# Patient Record
Sex: Male | Born: 1960 | Race: Black or African American | Hispanic: No | Smoking: Former smoker
Health system: Southern US, Community
[De-identification: ages and names within clinical notes are randomized; demographics above are authoritative.]

## PROBLEM LIST (undated history)

## (undated) DIAGNOSIS — E119 Type 2 diabetes mellitus without complications: Secondary | ICD-10-CM

## (undated) DIAGNOSIS — I10 Essential (primary) hypertension: Secondary | ICD-10-CM

---

## 1999-07-18 ENCOUNTER — Emergency Department (HOSPITAL_COMMUNITY): Admission: EM | Admit: 1999-07-18 | Discharge: 1999-07-18 | Payer: Self-pay | Admitting: Emergency Medicine

## 2006-07-25 ENCOUNTER — Emergency Department (HOSPITAL_COMMUNITY): Admission: EM | Admit: 2006-07-25 | Discharge: 2006-07-25 | Payer: Self-pay | Admitting: Family Medicine

## 2008-02-13 ENCOUNTER — Emergency Department (HOSPITAL_COMMUNITY): Admission: EM | Admit: 2008-02-13 | Discharge: 2008-02-13 | Payer: Self-pay | Admitting: Emergency Medicine

## 2008-03-18 ENCOUNTER — Ambulatory Visit (HOSPITAL_COMMUNITY): Admission: RE | Admit: 2008-03-18 | Discharge: 2008-03-18 | Payer: Self-pay | Admitting: Urology

## 2009-10-29 ENCOUNTER — Ambulatory Visit: Payer: Self-pay | Admitting: Gastroenterology

## 2009-10-29 DIAGNOSIS — D649 Anemia, unspecified: Secondary | ICD-10-CM

## 2009-10-29 DIAGNOSIS — R748 Abnormal levels of other serum enzymes: Secondary | ICD-10-CM | POA: Insufficient documentation

## 2009-10-30 LAB — CONVERTED CEMR LAB: Sickle Cell Screen: NEGATIVE

## 2009-11-21 ENCOUNTER — Ambulatory Visit (HOSPITAL_COMMUNITY)
Admission: RE | Admit: 2009-11-21 | Discharge: 2009-11-21 | Payer: Self-pay | Source: Home / Self Care | Admitting: Gastroenterology

## 2009-11-21 ENCOUNTER — Ambulatory Visit: Payer: Self-pay | Admitting: Gastroenterology

## 2009-11-21 ENCOUNTER — Telehealth (INDEPENDENT_AMBULATORY_CARE_PROVIDER_SITE_OTHER): Payer: Self-pay

## 2009-11-24 ENCOUNTER — Telehealth (INDEPENDENT_AMBULATORY_CARE_PROVIDER_SITE_OTHER): Payer: Self-pay

## 2009-11-24 ENCOUNTER — Encounter: Payer: Self-pay | Admitting: Gastroenterology

## 2009-11-28 ENCOUNTER — Encounter (INDEPENDENT_AMBULATORY_CARE_PROVIDER_SITE_OTHER): Payer: Self-pay

## 2009-12-03 ENCOUNTER — Encounter (INDEPENDENT_AMBULATORY_CARE_PROVIDER_SITE_OTHER): Payer: Self-pay

## 2009-12-05 ENCOUNTER — Encounter (INDEPENDENT_AMBULATORY_CARE_PROVIDER_SITE_OTHER): Payer: Self-pay | Admitting: *Deleted

## 2009-12-15 ENCOUNTER — Encounter: Payer: Self-pay | Admitting: Gastroenterology

## 2009-12-17 ENCOUNTER — Encounter (INDEPENDENT_AMBULATORY_CARE_PROVIDER_SITE_OTHER): Payer: Self-pay

## 2009-12-19 ENCOUNTER — Encounter (INDEPENDENT_AMBULATORY_CARE_PROVIDER_SITE_OTHER): Payer: Self-pay

## 2010-01-15 ENCOUNTER — Encounter: Payer: Self-pay | Admitting: Gastroenterology

## 2010-01-15 ENCOUNTER — Encounter (INDEPENDENT_AMBULATORY_CARE_PROVIDER_SITE_OTHER): Payer: Self-pay

## 2010-01-19 ENCOUNTER — Encounter: Payer: Self-pay | Admitting: Internal Medicine

## 2010-02-19 ENCOUNTER — Encounter: Payer: Self-pay | Admitting: Internal Medicine

## 2010-02-24 ENCOUNTER — Encounter: Payer: Self-pay | Admitting: Gastroenterology

## 2010-02-24 ENCOUNTER — Encounter: Payer: Self-pay | Admitting: Internal Medicine

## 2010-02-25 ENCOUNTER — Ambulatory Visit (HOSPITAL_COMMUNITY): Admission: RE | Admit: 2010-02-25 | Payer: Self-pay | Source: Home / Self Care | Admitting: Internal Medicine

## 2010-03-16 ENCOUNTER — Ambulatory Visit: Admit: 2010-03-16 | Payer: Self-pay | Admitting: Gastroenterology

## 2010-03-31 NOTE — Letter (Signed)
Summary: Normal Results Letter  [facility]  [address]   [place], [place] [address]   Phone: [phone]  Fax: [phone]    November 28, 2009  Kolston Woelfel [address] [place], [place]  [address] 17-Aug-1960   Dear Mr. BULKLEY,   Our office has been trying to contact you.  We just wanted to inform you that all of your tests were normal.  Please call the office at [phone] to schedule capsule study to complete your evaluation.   Thank you,  Hendricks Limes, LPN Cloria Spring, LPN  [facility] Ph: [phone]   Fax: [phone]

## 2010-03-31 NOTE — Letter (Signed)
Summary: Radiology Test Reminder  [facility]  [address]   [place], [place] [address]   Phone: [phone]  Fax: [phone]     December 05, 2009   Charles Travis [address] [place], [place]  [address] [birth year]  Dear [name],  During your last appointment, your doctor requested you have Givens Capsule Study. We had you scheduled for this test on December 05, 2009.  Our records indicate you have not had this done.  Remember it is very important to follow your doctor's instructions.    Please call our office and we can reschedule this for you.  It is important that patients and their doctor work together in the management and treatment of their health care.  If you have already had your test done, please disregard this letter.  Thank you,    Ave Filter  [facility] Ph: [phone]   Fax: [phone]

## 2010-03-31 NOTE — Letter (Signed)
Summary: Plan of Care, Need to Discuss  [facility]  [address]   [place], [place] [address]   Phone: [phone]  Fax: [phone]    January 15, 2010  Charles Travis [address] [place], [place]  [address] Jul 02, 1960   Dear [name],   We are writing this letter to inform you of treatment plans and/or discuss your plan of care.  We have tried several times to contact you. Dr. Darrick Penna said you need to submit a stool test, we will have the bottle and instructions at the front desk for pick-up. Please ask to speak to the nurse when you pick it up to get verbal instructions/explanation on how to do the test.  Look forward to hearing from you soon.  Please do not neglect your health.   Sincerely,    Cloria Spring LPN  [facility] Ph: [phone]    Fax: [phone]

## 2010-03-31 NOTE — Miscellaneous (Signed)
Summary: Orders Update  Clinical Lists Changes  Orders: Added new Test order of T-CBC w/Diff ([id number]) - Signed Added new Test order of T-Ferritin ([id number]) - Signed 

## 2010-03-31 NOTE — Progress Notes (Signed)
Summary: pt wants to reschedule his Givens  Phone Note Call from Patient   Caller: Patient Summary of Call: Pt called and would like to reschedule his Givens in December around 27th, 28th or 29th. Please advise! would like to know if it is more urgent and should be done before them. Initial call taken by: Cloria Spring LPN,  November 24, 2009 10:02 AM     Appended Document: pt wants to reschedule his Givens Call back number is [phone].  Appended Document: pt wants to reschedule his Givens Please call pt. The highest yield for finding a cause for his low blood count is if we do teh capsule ASAP. I do not recommedn waiting until DEC to have the study performed.  Appended Document: pt wants to reschedule his [employer] Called the call back number above and it was not working, called the above home number listed and it is his Mom's number. She gave me another number to call [phone] and it was not working. I called her back and asked her to have him call me. She said she would.  Appended Document: pt wants to reschedule his Givens Called the home number and many rings and now answer. Called the [phone] and the call would not go through. Mailing a letter to all.

## 2010-03-31 NOTE — Assessment & Plan Note (Signed)
Summary: ANEMIA, elevated alkaline phoshatase   Visit Type:  Initial Consult Referring Provider:  Sasser Primary Care Provider:  Neita Carp, M.D.  Chief Complaint:  Anemia/consult for tcs/egd.  History of Present Illness: No bleeding from rectum, change in bowel habits. BMs: 2-3 times a day, soft and loose. No black tarry stools, abd pain, nausea, problems swallowing, or vomiting. Weight loss: intentional, 20 lbs. No constipation, heartburn, or indigestion. Not taking Naproxen-was taking for his hand pain for one year.  Takes ASA, but no BC, Goodys, Ibuprofen, or Motrin.  Preventive Screening-Counseling & Management  Alcohol-Tobacco     Smoking Status: quit  Current Medications (verified): 1)  Amlodipine Besylate 5 Mg Tabs (Amlodipine Besylate) .... Take 1 Tablet By Mouth Once A Day 2)  Metformin Hcl 500 Mg Tabs (Metformin Hcl) .... Two Tablets Twice Daily 3)  Lisinopril-Hydrochlorothiazide 20-12.5 Mg Tabs (Lisinopril-Hydrochlorothiazide) .... Take 1 Tablet By Mouth Once A Day 4)  Aspirin 81 Mg Tbec (Aspirin) .... Take 1 Tablet By Mouth Once A Day 5)  Protonix 40 Mg Tbec (Pantoprazole Sodium) .... Take 1 Tablet By Mouth Once A Day 6)  Zyloprim 300 Mg Tabs (Allopurinol) .... Take 1 Tablet By Mouth Once A Day 7)  Flovent Diskus 50 Mcg/blist Aepb (Fluticasone Propionate (Inhal)) .... As Directed  Allergies (verified): No Known Drug Allergies  Past History:  Past Medical History: Diabetes: x 5 years, HgA1c 8.2-12 IN 2011 Hypertension GERD Allergies  Past Surgical History: None  Family History: FH of Colon Cancer: uncle No Polyps Mom has leukemia  Social History: Widowed: 3 bio kids, youngest-26, 4 step kids Girlfriend-Patricia Set designer for [employer]. 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 Quit 3 years. Alcohol Use - yes: every other weekend Smoking Status:  quit  Review of Systems       No chest pain, or SOB.  DEC 2010: URINE  MICROALB 5.02 Jun 2009: ALK PHOS 149 AST 26 ALT 33 CR 0.81 TRIG 1087 H  HGA1C 8.2   Oct 07 2009:  ALK PHOS 158H(ULN 150) AST 24 ALT 25 T BILI 0.2 HB 10.5L MCV 78 L  PLT 135  TRIG 414 H HGA1C 12H  Oct 14 2009: TIBC 428 FERRITIN 313 B12 339 HB 11.4L   MCV 77L PLT 164  RETICULOCYTE INDEX 1.66-2.3 BASED ON LAB NORMS(HCT 36-50)  (> 2%: normal, <2%: hypoproliferation)  Per HPI otherwise all systems negative.  Vital Signs:  Patient profile:   49 year old male Height:      74 inches Weight:      269 pounds BMI:     34.66 Temp:     98.9 degrees F oral Pulse rate:   84 / minute BP sitting:   120 / 82  Vitals Entered By: Cloria Spring LPN (October 29, 2009 1:27 PM)  Physical Exam  General:  Well developed, well nourished, no acute distress. Head:  Normocephalic and atraumatic. Eyes:  PERRL, no icterus. Mouth:  No deformity or lesions. Neck:  Supple; no masses. Lungs:  Clear throughout to auscultation. Heart:  Regular rate and rhythm; no murmurs. Abdomen:  Soft, nontender and nondistended. No masses,  or hernias noted. Normal bowel sounds. Extremities:  No edema or deformities noted. Neurologic:  Alert and  oriented x4;  grossly normal neurologically.  Impression & Recommendations:  Problem # 1:  ANEMIA, MILD (ICD-285.9) MICROCYTIC ANEMIA WITH normal Ferritin and TIBC. Microcytic indices suggest early FeDA in a pt on ASA and Naproxen. Differential diagnosis includes H.  pylori gastritis, NSAID gastritis, colorectal polyps and less likely CRC, SICKLE CELL TRAIT CAUSING MICROCYTIC INDICES, PUD, or AVMs. TCS/EGD on 9/23-halflytely prep. Stop iron on 11/15/2009. CONTINUE METFORMIN MAY HAVE SCRAMBLED EGGS FOR BREAKFAST TO TAKE PILLS. START CLEAR LIQUIDS AFTER EATING THE EGGS. FOLLOW UP IN 4 MOS. Check sickle cell screen.  PT GIVES PERMISSION TO PATRICIA WILLIAMS TO DISCUSS HEALTH INFORMATION.  CC: PCP  Problem # 2:  ALKALINE PHOSPHATASE, ELEVATED (ICD-790.5) Assessment: Comment Only Not  clinically significant. Repeat HFP at next visit.  Other Orders: Sickle Cell Scr[facility] ([id number])  Patient Instructions: 1)  Pt needs upper and lower endoscopy on 11/21/2009. 2)  Stop iron on 11/15/2009. CONTINUE METFORMIN 3)  MAY HAVE SCRAMBLED EGGS FOR BREAKFAST TO TAKE PILLS. 4)  START CLEAR LIQUIDS AFTER EATING THE EGGS. 5)  FOLLOW UP IN 4 MOS. 6)  The medication list was reviewed and reconciled.  All changed / newly prescribed medications were explained.  A complete medication list was provided to the patient / caregiver.  Appended Document: Orders Update    Clinical Lists Changes  Orders: Added new Service order of Consultation Level IV [phone]) - Signed      Appended Document: ANEMIA, elevated alkaline phoshatase 4 MONTH F/U OPV IS IN THE COMPUTER

## 2010-03-31 NOTE — Miscellaneous (Signed)
Summary: Orders Update  Clinical Lists Changes  Orders: Added new Test order of T-CBC w/Diff [phone]) - Signed Added new Test order of T-Ferritin ([id number]) - Signed  Appended Document: Orders Update Please contact pt. He needs to submit stool for iFOBT.  Appended Document: Orders Update Called the number listed, [phone], his Mom answered. Gave me his cell number, [phone], and call would not go through. Called another number that has left before to call, [phone] and left message to call. Will also mail a letter informing him that he needs the iFOBT.

## 2010-03-31 NOTE — Letter (Signed)
Summary: GIVENS ORDER  GIVENS ORDER   Imported By: Ave Filter 11/21/2009 10:36:16  _____________________________________________________________________  External Attachment:    Type:   Image     Comment:   External Document

## 2010-03-31 NOTE — Letter (Signed)
Summary: Recall, Labs Needed  [facility]  [address]   [place], [place] [address]   Phone: [phone]  Fax: [phone]    December 17, 2009  Eason Nuss [address] [place], [place]  [address] 1960-08-21   Dear Mr. GABBARD,   Our records indicate it is time to repeat your blood work.  You can take the enclosed form to the lab on or near the date indicated.  Please make note of the new location of the lab:   [address], 2nd floor   [facility]  Our office will call you within a week to ten business days with the results.  If you do not hear from [place] in 10 business days, you should call the office.  If you have any questions regarding this, call the office at [phone], and ask for the nurse.  Labs are due on 01/21/2010.   Sincerely,    Hendricks Limes LPN  [facility] Ph: [phone]   Fax: [phone]

## 2010-03-31 NOTE — Progress Notes (Signed)
----   Converted from flag ---- ---- 11/21/2009 9:53 AM, Ave Filter wrote: Dr Darrick Penna would like a repeat CBC and Ferrtin in two months. ------------------------------

## 2010-03-31 NOTE — Letter (Signed)
Summary: TCS/EGD ORDER  TCS/EGD ORDER   Imported By: Ave Filter 10/29/2009 14:53:59  _____________________________________________________________________  External Attachment:    Type:   Image     Comment:   External Document

## 2010-03-31 NOTE — Miscellaneous (Signed)
Summary: Orders Update  Clinical Lists Changes  Orders: Added new Test order of T-CBC w/Diff [phone]) - Signed Added new Test order of T-Ferritin [phone]) - Signed  Appended Document: Orders Update please disregard this update. DS had already put the orders in, so I cancelled these.

## 2010-03-31 NOTE — Letter (Signed)
Summary: Plan of Care, Need to Discuss  [facility]  [address]   [place], [place] [address]   Phone: [phone]  Fax: [phone]    December 19, 2009  Charles Travis [address] [place], [place]  [address] [birth year]   Dear [name],   We are writing this letter to inform you of treatment plans and/or discuss your plan of care.  We have tried several times to contact you; however, we have yet to reach you.  We ask that you please contact our office for follow-up on your gastrointestinal issues.  We can  be reached at [phone] to schedule an appointment, or to speak with someone regarding your health care needs.  Please do not neglect your health.   Sincerely,    Cloria Spring LPN  [facility] Ph: [phone]    Fax: [phone]   Appended Document: Plan of Care, Need to Discuss Pt LMOM and can reach him at [phone]  Appended Document: Plan of Care, Need to Discuss Called and [name] for pt to call.  Appended Document: Plan of Care, Need to Discuss LMOM to call.  Appended Document: Plan of Care, Need to Discuss Mailed letter to call on 01/15/2010.

## 2010-03-31 NOTE — Letter (Signed)
Summary: [employer]---INFO OF CAPSULE STUDY  [employer]BS---INFO OF CAPSULE STUDY   Imported By: Ave Filter 12/15/2009 08:41:31  _____________________________________________________________________  External Attachment:    Type:   Image     Comment:   External Document  Appended Document: BCBS---INFO OF CAPSULE STUDY Please call pt. [employer] denied capsule study. Needs iFOBT.  Appended Document: [employer]---INFO OF CAPSULE STUDY Called x 2 and busy.  Appended Document: [employer]---INFO OF CAPSULE STUDY Called, many rings and now answer. Mailing letter to call.

## 2010-03-31 NOTE — Letter (Signed)
Summary: Plan of Care, Need to Discuss  [facility]  [address]   [place], [place] [address]   Phone: [phone]  Fax: [phone]    December 03, 2009  Charles Travis [address] [place], [place]  [address] 06-14-1960   Dear [name],   We are writing this letter to inform you of treatment plans and/or discuss your plan of care.  We have tried several times to contact you; however, we have yet to reach you.  We ask that you please contact our office for follow-up on your gastrointestinal issues.  We can  be reached at [phone] to schedule an appointment, or to speak with someone regarding your health care needs.  Please do not neglect your health.   Sincerely,    Cloria Spring LPN  [facility] Ph: [phone]    Fax: [phone]

## 2010-04-02 NOTE — Letter (Signed)
Summary: RECEIPT OF DENIAL FOR GIVENS CAPSULE FROM BCBS  RECIPT OF DENIAL FOR GIVENS CAPSULE FROM [employer]   Imported By: Ave Filter 02/24/2010 09:22:05  _____________________________________________________________________  External Attachment:    Type:   Image     Comment:   External Document

## 2010-04-02 NOTE — Letter (Signed)
Summary: GIVENS ORDER  GIVENS ORDER   Imported By: Ave Filter 01/19/2010 12:15:05  _____________________________________________________________________  External Attachment:    Type:   Image     Comment:   External Document  Appended Document: GIVENS ORDER Pt called back and stated since his ins is not paying for him to have Givens Capsule. He doesn't want to have the procedure. I explained to him we can do an appeal for the procedure with the ins company and he stated he still didn't want to have this procedure.

## 2010-04-02 NOTE — Letter (Signed)
Summary: INS RECEIPT REQUEST  INS RECEIPT REQUEST   Imported By: Ave Filter 02/19/2010 15:24:38  _____________________________________________________________________  External Attachment:    Type:   Image     Comment:   External Document

## 2010-12-04 LAB — POCT I-STAT, CHEM 8
Chloride: 104 mEq/L (ref 96–112)
Creatinine, Ser: 1.3 mg/dL (ref 0.4–1.5)
Glucose, Bld: 158 mg/dL — ABNORMAL HIGH (ref 70–99)
HCT: 36 % — ABNORMAL LOW (ref 39.0–52.0)
Potassium: 3.7 mEq/L (ref 3.5–5.1)

## 2012-01-23 ENCOUNTER — Emergency Department (HOSPITAL_COMMUNITY)
Admission: EM | Admit: 2012-01-23 | Discharge: 2012-01-23 | Disposition: A | Discharge status: Home / Self Care | Payer: Worker's Compensation | Attending: Emergency Medicine | Admitting: Emergency Medicine

## 2012-01-23 ENCOUNTER — Encounter (HOSPITAL_COMMUNITY): Payer: Self-pay | Admitting: Emergency Medicine

## 2012-01-23 ENCOUNTER — Emergency Department (HOSPITAL_COMMUNITY): Payer: Worker's Compensation

## 2012-01-23 DIAGNOSIS — R0602 Shortness of breath: Secondary | ICD-10-CM | POA: Insufficient documentation

## 2012-01-23 DIAGNOSIS — E119 Type 2 diabetes mellitus without complications: Secondary | ICD-10-CM | POA: Insufficient documentation

## 2012-01-23 DIAGNOSIS — R059 Cough, unspecified: Secondary | ICD-10-CM | POA: Insufficient documentation

## 2012-01-23 DIAGNOSIS — R062 Wheezing: Secondary | ICD-10-CM | POA: Insufficient documentation

## 2012-01-23 DIAGNOSIS — J9801 Acute bronchospasm: Secondary | ICD-10-CM | POA: Insufficient documentation

## 2012-01-23 DIAGNOSIS — Z87891 Personal history of nicotine dependence: Secondary | ICD-10-CM | POA: Insufficient documentation

## 2012-01-23 DIAGNOSIS — J9809 Other diseases of bronchus, not elsewhere classified: Secondary | ICD-10-CM

## 2012-01-23 DIAGNOSIS — R05 Cough: Secondary | ICD-10-CM | POA: Insufficient documentation

## 2012-01-23 DIAGNOSIS — Z79899 Other long term (current) drug therapy: Secondary | ICD-10-CM | POA: Insufficient documentation

## 2012-01-23 HISTORY — DX: Type 2 diabetes mellitus without complications: E11.9

## 2012-01-23 LAB — COMPREHENSIVE METABOLIC PANEL
Alkaline Phosphatase: 181 U/L — ABNORMAL HIGH (ref 39–117)
BUN: 16 mg/dL (ref 6–23)
CO2: 26 mEq/L (ref 19–32)
Chloride: 102 mEq/L (ref 96–112)
GFR calc Af Amer: >90 mL/min (ref >90)
GFR calc non Af Amer: >90 mL/min (ref >90)
Glucose, Bld: 349 mg/dL — ABNORMAL HIGH (ref 70–99)
Potassium: 3.6 mEq/L (ref 3.5–5.1)
Total Bilirubin: 0.2 mg/dL — ABNORMAL LOW (ref 0.3–1.2)

## 2012-01-23 LAB — CBC WITH DIFFERENTIAL/PLATELET
Basophils Relative: 0 % (ref 0–1)
Eosinophils Absolute: 0.1 10*3/uL (ref 0.0–0.7)
Eosinophils Relative: 1 % (ref 0–5)
HCT: 36.2 % — ABNORMAL LOW (ref 39.0–52.0)
Hemoglobin: 11.7 g/dL — ABNORMAL LOW (ref 13.0–17.0)
Lymphocytes Relative: 16 % (ref 12–46)
MCHC: 32.3 g/dL (ref 30.0–36.0)
Monocytes Relative: 5 % (ref 3–12)
Neutro Abs: 7.4 10*3/uL (ref 1.7–7.7)

## 2012-01-23 LAB — POCT I-STAT TROPONIN I: Troponin i, poc: 0 ng/mL (ref 0.00–0.08)

## 2012-01-23 MED ORDER — ALBUTEROL SULFATE HFA 108 (90 BASE) MCG/ACT IN AERS: 2.0000 | INHALATION_SPRAY | RESPIRATORY_TRACT | Status: DC | PRN

## 2012-01-23 MED ORDER — IPRATROPIUM BROMIDE 0.02 % IN SOLN
0.5000 mg | Freq: Once | RESPIRATORY_TRACT | Status: AC
  Administered 2012-01-23: 0.5 mg via RESPIRATORY_TRACT
  Filled 2012-01-23: qty 2.5

## 2012-01-23 MED ORDER — INSULIN ASPART 100 UNIT/ML ~~LOC~~ SOLN
10.0000 [IU] | Freq: Once | SUBCUTANEOUS | Status: AC
  Administered 2012-01-23: 10 [IU] via SUBCUTANEOUS
  Filled 2012-01-23: qty 1

## 2012-01-23 MED ORDER — INSULIN REGULAR HUMAN 100 UNIT/ML IJ SOLN: 10.0000 [IU] | Freq: Once | INTRAMUSCULAR | Status: DC

## 2012-01-23 MED ORDER — METHYLPREDNISOLONE SODIUM SUCC 125 MG IJ SOLR
125.0000 mg | Freq: Once | INTRAMUSCULAR | Status: AC
  Administered 2012-01-23: 125 mg via INTRAVENOUS
  Filled 2012-01-23: qty 2

## 2012-01-23 MED ORDER — ALBUTEROL (5 MG/ML) CONTINUOUS INHALATION SOLN: 10.0000 mg/h | INHALATION_SOLUTION | Freq: Once | RESPIRATORY_TRACT | Status: DC

## 2012-01-23 MED ORDER — ALBUTEROL SULFATE (5 MG/ML) 0.5% IN NEBU
INHALATION_SOLUTION | RESPIRATORY_TRACT | Status: AC
  Filled 2012-01-23: qty 2

## 2012-01-23 MED ORDER — ALBUTEROL (5 MG/ML) CONTINUOUS INHALATION SOLN
10.0000 mg/h | INHALATION_SOLUTION | Freq: Once | RESPIRATORY_TRACT | Status: AC
  Administered 2012-01-23: 10 mg/h via RESPIRATORY_TRACT

## 2012-01-23 MED ORDER — ALBUTEROL SULFATE (5 MG/ML) 0.5% IN NEBU
5.0000 mg | INHALATION_SOLUTION | Freq: Once | RESPIRATORY_TRACT | Status: AC
  Administered 2012-01-23: 5 mg via RESPIRATORY_TRACT
  Filled 2012-01-23: qty 1

## 2012-01-23 MED ORDER — SODIUM CHLORIDE 0.9 % IV BOLUS (SEPSIS)
1000.0000 mL | Freq: Once | INTRAVENOUS | Status: AC
  Administered 2012-01-23: 1000 mL via INTRAVENOUS

## 2012-01-23 MED ORDER — PREDNISONE 50 MG PO TABS: 50.0000 mg | ORAL_TABLET | Freq: Every day | ORAL | Status: DC

## 2012-01-23 NOTE — ED Provider Notes (Signed)
History     CSN: [id number]  Arrival date & time 01/23/12  1058   First MD Initiated Contact with Patient 01/23/12 1117      Chief Complaint  Patient presents with  . Shortness of Breath    inhaled chlorine dioxide at work on Thursday noc, chest pain and shortness of breath since  . Chest Pain    (Consider location/radiation/quality/duration/timing/severity/associated sxs/prior treatment) HPI Pt exposed to chlorine gas Thursday at work and has had chest tightness, wheezing and cough since. No fever chills. No lower ext swelling or pain.  Past Medical History  Diagnosis Date  . Diabetes mellitus without complication     History reviewed. No pertinent past surgical history.  No family history on file.  History  Substance Use Topics  . Smoking status: Former Smoker    Quit date: 01/22/2006  . Smokeless tobacco: Never Used  . Alcohol Use: Yes     Comment: occasionally      Review of Systems  Constitutional: Negative for fever and chills.  HENT: Negative for neck pain.   Respiratory: Positive for cough, chest tightness, shortness of breath and wheezing.   Cardiovascular: Positive for chest pain. Negative for palpitations and leg swelling.  Gastrointestinal: Negative for nausea, vomiting and abdominal pain.  Skin: Negative for rash and wound.  Neurological: Negative for dizziness, weakness, numbness and headaches.    Allergies  Review of patient's allergies indicates no known allergies.  Home Medications   Current Outpatient Rx  Name  Route  Sig  Dispense  Refill  . ALBUTEROL SULFATE HFA 108 (90 BASE) MCG/ACT IN AERS   Inhalation   Inhale 2 puffs into the lungs every 6 (six) hours as needed. For shortness of breath.         . ALLOPURINOL 300 MG PO TABS   Oral   Take 300 mg by mouth daily.         Marland Kitchen AMLODIPINE BESYLATE 5 MG PO TABS   Oral   Take 5 mg by mouth daily.         Marland Kitchen FERROUS SULFATE 325 (65 FE) MG PO TABS   Oral   Take 325 mg by mouth  daily with breakfast.         . GEMFIBROZIL 600 MG PO TABS   Oral   Take 600 mg by mouth 2 (two) times daily.         Marland Kitchen GLIPIZIDE ER 10 MG PO TB24   Oral   Take 10 mg by mouth daily.         Marland Kitchen LISINOPRIL-HYDROCHLOROTHIAZIDE 20-12.5 MG PO TABS   Oral   Take 1 tablet by mouth daily.         Marland Kitchen METFORMIN HCL 500 MG PO TABS   Oral   Take 1,000 mg by mouth 2 (two) times daily with a meal.         . ADULT MULTIVITAMIN W/MINERALS CH   Oral   Take 1 tablet by mouth daily.         Marland Kitchen PANTOPRAZOLE SODIUM 40 MG PO TBEC   Oral   Take 40 mg by mouth daily.         Marland Kitchen PREDNISONE (PAK) 5 MG PO TABS   Oral   Take 5-30 mg by mouth daily. 6 day dosepak, titrating down 1 tab per day.  Started 01/21/2012.         Marland Kitchen ALBUTEROL SULFATE HFA 108 (90 BASE) MCG/ACT IN AERS   Inhalation  Inhale 2 puffs into the lungs every 4 (four) hours as needed for wheezing.   1 Inhaler   0   . PREDNISONE 50 MG PO TABS   Oral   Take 1 tablet (50 mg total) by mouth daily.   5 tablet   0     BP 159/92  Pulse 76  Temp 98.3 F (36.8 C) (Oral)  Resp 15  SpO2 100%  Physical Exam  Nursing note and vitals reviewed. Constitutional: He is oriented to person, place, and time. He appears well-developed and well-nourished. No distress.  HENT:  Head: Normocephalic and atraumatic.  Mouth/Throat: Oropharynx is clear and moist.  Eyes: EOM are normal. Pupils are equal, round, and reactive to light.  Neck: Normal range of motion. Neck supple.  Cardiovascular: Normal rate and regular rhythm.   Pulmonary/Chest: No respiratory distress. He has wheezes. He has no rales. He exhibits no tenderness.       Increased effort with diffuse wheezing  Abdominal: Soft. Bowel sounds are normal. He exhibits no distension and no mass. There is no tenderness. There is no rebound and no guarding.  Musculoskeletal: Normal range of motion. He exhibits no edema and no tenderness.       No calf swelling or pain    Neurological: He is alert and oriented to person, place, and time.       5/5 motor, sensation intact  Skin: Skin is warm and dry. No rash noted. No erythema.  Psychiatric: He has a normal mood and affect. His behavior is normal.    ED Course  Procedures (including critical care time)  Labs Reviewed  CBC WITH DIFFERENTIAL - Abnormal; Notable for the following:    Hemoglobin 11.7 (*)     HCT 36.2 (*)     MCV 76.9 (*)     MCH 24.8 (*)     Neutrophils Relative 78 (*)     All other components within normal limits  COMPREHENSIVE METABOLIC PANEL - Abnormal; Notable for the following:    Glucose, Bld 349 (*)     Alkaline Phosphatase 181 (*)     Total Bilirubin 0.2 (*)     All other components within normal limits  POCT I-STAT TROPONIN I   Dg Chest 2 View  01/23/2012  *RADIOLOGY REPORT*  Clinical Data: Inhalation injury 3 days ago.  Increasing shortness of breath.  CHEST - 2 VIEW  Comparison: None.  Findings: There is mild cardiomegaly but no pulmonary edema.  Lungs are clear.  No pneumothorax or pleural fluid.  IMPRESSION: Mild cardiomegaly without acute disease.   Original Report Authenticated By: Holley Dexter, M.D.      1. Recurrent bronchospasm      Date: 01/23/2012  Rate: 89  Rhythm: normal sinus rhythm  QRS Axis: normal  Intervals: normal  ST/T Wave abnormalities: nonspecific T wave changes  Conduction Disutrbances:none  Narrative Interpretation:   Old EKG Reviewed: none available    MDM  Pt states he is feeling better though still has diffuse wheezing on exam. Will give continuous albuterol neb and re-eval.    Pt continues to feel better though significant wheezing still present. Pt declined admission stating he wanted to go home. Agreed to 1 more neb treatment in ED.      Loren Racer, MD 01/23/12 1524

## 2012-01-23 NOTE — ED Notes (Signed)
Thurs. Inhaled chlorine dioxde @ work immediately got nausea, dry heaves, coughing & sob. Fri. Sent to employee health and diagnosed with lung injury treated with chest xray,  demerol IM & dose pack. Productive clear phalgm, audible wheezing.

## 2012-01-23 NOTE — ED Notes (Signed)
MD at bedside. Mayford Knife is here to evaluate patient.

## 2012-01-23 NOTE — ED Notes (Signed)
Pt present w/ shortness of breath, states unable to breath since exposure to chlorine gas at work on Thursday night. Has had chest pain and a lot of coughing since this exposure. Was treated Prednisone and Demeral IM for pain

## 2013-09-16 ENCOUNTER — Encounter (HOSPITAL_COMMUNITY): Payer: Self-pay | Admitting: Emergency Medicine

## 2013-09-16 ENCOUNTER — Emergency Department (HOSPITAL_COMMUNITY)
Admission: EM | Admit: 2013-09-16 | Discharge: 2013-09-16 | Disposition: A | Discharge status: Home / Self Care | Payer: Self-pay | Attending: Emergency Medicine | Admitting: Emergency Medicine

## 2013-09-16 DIAGNOSIS — Z87891 Personal history of nicotine dependence: Secondary | ICD-10-CM | POA: Insufficient documentation

## 2013-09-16 DIAGNOSIS — Z79899 Other long term (current) drug therapy: Secondary | ICD-10-CM | POA: Insufficient documentation

## 2013-09-16 DIAGNOSIS — L0231 Cutaneous abscess of buttock: Secondary | ICD-10-CM | POA: Insufficient documentation

## 2013-09-16 DIAGNOSIS — E119 Type 2 diabetes mellitus without complications: Secondary | ICD-10-CM | POA: Insufficient documentation

## 2013-09-16 DIAGNOSIS — L03317 Cellulitis of buttock: Principal | ICD-10-CM

## 2013-09-16 LAB — CBG MONITORING, ED: GLUCOSE-CAPILLARY: 137 mg/dL — AB (ref 70–99)

## 2013-09-16 MED ORDER — SULFAMETHOXAZOLE-TRIMETHOPRIM 800-160 MG PO TABS: 1.0000 | ORAL_TABLET | Freq: Two times a day (BID) | ORAL | Status: AC

## 2013-09-16 MED ORDER — NAPROXEN 500 MG PO TABS: 500.0000 mg | ORAL_TABLET | Freq: Two times a day (BID) | ORAL | Status: DC

## 2013-09-16 MED ORDER — OXYCODONE-ACETAMINOPHEN 5-325 MG PO TABS
1.0000 | ORAL_TABLET | Freq: Once | ORAL | Status: AC
Start: 2013-09-16 — End: 2013-09-16
  Administered 2013-09-16: 1 via ORAL
  Filled 2013-09-16: qty 1

## 2013-09-16 MED ORDER — OXYCODONE-ACETAMINOPHEN 5-325 MG PO TABS: 1.0000 | ORAL_TABLET | Freq: Four times a day (QID) | ORAL | Status: DC | PRN

## 2013-09-16 NOTE — ED Provider Notes (Signed)
CSN: [id number]     Arrival date & time 09/16/13  1039 History   First MD Initiated Contact with Patient 09/16/13 1248     Chief Complaint  Patient presents with  . Abscess     (Consider location/radiation/quality/duration/timing/severity/associated sxs/prior Treatment) HPI Comments: Patient with history of diabetes presents with complaint of abscess on left buttock that has been becoming worse for the past 3 days. Patient is having trouble sitting and lying on the part of his body. He has had boils in the past but never in this area. He has been applying warm compresses and sitting in an Epson salt bath without relief. Area has had minimal drainage. No fever, nausea, or vomiting. Patient has not checked his blood sugar today. No other treatments prior to arrival. Nothing makes symptoms better.  The history is provided by the patient.    Past Medical History  Diagnosis Date  . Diabetes mellitus without complication    History reviewed. No pertinent past surgical history. History reviewed. No pertinent family history. History  Substance Use Topics  . Smoking status: Former Smoker    Quit date: 01/22/2006  . Smokeless tobacco: Never Used  . Alcohol Use: Yes     Comment: occasionally    Review of Systems  Constitutional: Negative for fever.  Gastrointestinal: Negative for nausea and vomiting.  Skin: Negative for color change.       Positive for abscess  Hematological: Negative for adenopathy.      Allergies  Review of patient's allergies indicates no known allergies.  Home Medications   Prior to Admission medications   Medication Sig Start Date End Date Taking? Authorizing Provider  albuterol (PROVENTIL HFA;VENTOLIN HFA) 108 (90 BASE) MCG/ACT inhaler Inhale 2 puffs into the lungs every 6 (six) hours as needed. For shortness of breath.    Historical Provider, MD  allopurinol (ZYLOPRIM) 300 MG tablet Take 300 mg by mouth daily.    Historical Provider, MD  amLODipine  (NORVASC) 5 MG tablet Take 5 mg by mouth daily.    Historical Provider, MD  ferrous sulfate 325 (65 FE) MG tablet Take 325 mg by mouth daily with breakfast.    Historical Provider, MD  gemfibrozil (LOPID) 600 MG tablet Take 600 mg by mouth 2 (two) times daily.    Historical Provider, MD  glipiZIDE (GLUCOTROL XL) 10 MG 24 hr tablet Take 10 mg by mouth daily.    Historical Provider, MD  lisinopril-hydrochlorothiazide (PRINZIDE,ZESTORETIC) 20-12.5 MG per tablet Take 1 tablet by mouth daily.    Historical Provider, MD  metFORMIN (GLUCOPHAGE) 500 MG tablet Take 1,000 mg by mouth 2 (two) times daily with a meal.    Historical Provider, MD  Multiple Vitamin (MULTIVITAMIN WITH MINERALS) TABS Take 1 tablet by mouth daily.    Historical Provider, MD  pantoprazole (PROTONIX) 40 MG tablet Take 40 mg by mouth daily.    Historical Provider, MD   BP 148/96  Pulse 90  Temp(Src) 99.1 F (37.3 C) (Oral)  Resp 16  SpO2 99%  Physical Exam  Nursing note and vitals reviewed. Constitutional: He appears well-developed and well-nourished.  HENT:  Head: Normocephalic and atraumatic.  Eyes: Conjunctivae are normal. Right eye exhibits no discharge. Left eye exhibits no discharge.  Neck: Normal range of motion. Neck supple.  Cardiovascular: Normal rate, regular rhythm and normal heart sounds.   Pulmonary/Chest: Effort normal and breath sounds normal.  Abdominal: Soft. There is no tenderness.  Neurological: He is alert.  Skin: Skin is warm and dry.  6 cm x 4 cm area of firm induration of the left buttock consistent with abscess. There is mild overlying erythema but no extension of cellulitis noted. No significant active drainage. Area is very tender to palpation.  Psychiatric: He has a normal mood and affect.    ED Course  Procedures (including critical care time) Labs Review Labs Reviewed  CBG MONITORING, ED    Imaging Review No results found.   EKG Interpretation None      1:28 PM Patient seen and  examined. Work-up initiated. Explained I&D procedure and patient agrees to proceed.    Vital signs reviewed and are as follows: Filed Vitals:   09/16/13 1106  BP: 148/96  Pulse: 90  Temp: 99.1 F (37.3 C)  Resp: 16   INCISION AND DRAINAGE Performed by: Carolee RotaGEIPLE,Heriberto Stmartin S Consent: Verbal consent obtained. Risks and benefits: risks, benefits and alternatives were discussed Type: abscess  Body area: L medial buttock  Anesthesia: local infiltration  Incision was made with a scalpel.  Local anesthetic: lidocaine 2% with epinephrine  Anesthetic total: 5 ml  Complexity: complex Blunt dissection to break up loculations  Drainage: purulent  Drainage amount: moderate  Packing material: none  Patient tolerance: Patient tolerated the procedure well with no immediate complications.    The patient was urged to return to the Emergency Department urgently with worsening pain, swelling, expanding erythema especially if it streaks away from the affected area, fever, or if they have any other concerns.   Discussed need to take entire course of antibiotics as prescribed.   The patient was urged to return to the Emergency Department or go to their PCP in 48 hours for wound recheck if the area is not significantly improved.  Patient counseled on use of narcotic pain medications. Counseled not to combine these medications with others containing tylenol. Urged not to drink alcohol, drive, or perform any other activities that requires focus while taking these medications. The patient verbalizes understanding and agrees with the plan.  The patient verbalized understanding and stated agreement with this plan.    MDM   Final diagnoses:  Abscess of left buttock   Patient with large skin abscess amenable to incision and drainage. No signs of cellulitis in surrounding skin -- however given location, history of DM, and size -- feel treatment with Bactrim is reasonable. Patient's pain controlled.  He agrees to perform warm sitz baths 2-3 times a day for next 3 days. If symptoms are not improved in 48 hrs he agrees to return for a recheck. Do not feel he needs to return if symptoms are improving. Patient seems reliable. His girlfriend is in healthcare.   No dangerous or life-threatening conditions suspected or identified by history, physical exam, and by work-up. No indications for hospitalization identified.      Renne CriglerJoshua Trevionne Advani, PA-C 09/16/13 1600

## 2013-09-16 NOTE — ED Notes (Signed)
Pt c/o abscess on sacrum x 3 days.  Pain score 7/10.  Pt reports applying warm compresses and sitting in epsom salts w/o relief.

## 2013-09-16 NOTE — Discharge Instructions (Signed)
Please read and follow all provided instructions.  Your diagnoses today include:  1. Abscess of left buttock     Tests performed today include:  Vital signs. See below for your results today.   Medications prescribed:   Percocet (oxycodone/acetaminophen) - narcotic pain medication  DO NOT drive or perform any activities that require you to be awake and alert because this medicine can make you drowsy. BE VERY CAREFUL not to take multiple medicines containing Tylenol (also called acetaminophen). Doing so can lead to an overdose which can damage your liver and cause liver failure and possibly death.   Naproxen - anti-inflammatory pain medication  Do not exceed 500mg  naproxen every 12 hours, take with food  You have been prescribed an anti-inflammatory medication or NSAID. Take with food. Take smallest effective dose for the shortest duration needed for your pain. Stop taking if you experience stomach pain or vomiting.   Take any prescribed medications only as directed.   Home care instructions:   Follow any educational materials contained in this packet  Follow-up instructions: Return to the Emergency Department in 48 hours for a recheck if your symptoms are not significantly improved.  Please follow-up with your primary care provider in the next 1 week for further evaluation of your symptoms.   Return instructions:  Return to the Emergency Department if you have:  Fever  Worsening symptoms  Worsening pain  Worsening swelling  Redness of the skin that moves away from the affected area, especially if it streaks away from the affected area   Any other emergent concerns  Your vital signs today were: BP 148/96   Pulse 90   Temp(Src) 99.1 F (37.3 C) (Oral)   Resp 16   SpO2 99% If your blood pressure (BP) was elevated above 135/85 this visit, please have this repeated by your doctor within one month. --------------

## 2013-09-19 NOTE — ED Provider Notes (Signed)
Medical screening examination/treatment/procedure(s) were performed by non-physician practitioner and as supervising physician I was immediately available for consultation/collaboration.   Smayan Hackbart T Eligio Angert, MD 09/19/13 1709 

## 2014-09-05 ENCOUNTER — Emergency Department (HOSPITAL_COMMUNITY)
Admission: EM | Admit: 2014-09-05 | Discharge: 2014-09-05 | Discharge status: Absent without leave | Payer: No Typology Code available for payment source

## 2015-01-12 ENCOUNTER — Encounter (HOSPITAL_COMMUNITY): Payer: Self-pay | Admitting: Emergency Medicine

## 2015-01-12 ENCOUNTER — Emergency Department (HOSPITAL_COMMUNITY)
Admission: EM | Admit: 2015-01-12 | Discharge: 2015-01-12 | Disposition: A | Discharge status: Home / Self Care | Payer: No Typology Code available for payment source | Attending: Emergency Medicine | Admitting: Emergency Medicine

## 2015-01-12 ENCOUNTER — Emergency Department (HOSPITAL_COMMUNITY): Payer: No Typology Code available for payment source

## 2015-01-12 DIAGNOSIS — J40 Bronchitis, not specified as acute or chronic: Secondary | ICD-10-CM

## 2015-01-12 DIAGNOSIS — Z87891 Personal history of nicotine dependence: Secondary | ICD-10-CM | POA: Insufficient documentation

## 2015-01-12 DIAGNOSIS — E119 Type 2 diabetes mellitus without complications: Secondary | ICD-10-CM | POA: Insufficient documentation

## 2015-01-12 DIAGNOSIS — Z79899 Other long term (current) drug therapy: Secondary | ICD-10-CM | POA: Insufficient documentation

## 2015-01-12 DIAGNOSIS — J209 Acute bronchitis, unspecified: Secondary | ICD-10-CM | POA: Insufficient documentation

## 2015-01-12 DIAGNOSIS — I10 Essential (primary) hypertension: Secondary | ICD-10-CM | POA: Insufficient documentation

## 2015-01-12 LAB — CBC WITH DIFFERENTIAL/PLATELET
Basophils Absolute: 0 10*3/uL (ref 0.0–0.1)
Basophils Relative: 1 %
Eosinophils Absolute: 0.3 10*3/uL (ref 0.0–0.7)
Eosinophils Relative: 5 %
HCT: 39.8 % (ref 39.0–52.0)
Hemoglobin: 12.6 g/dL — ABNORMAL LOW (ref 13.0–17.0)
Lymphocytes Relative: 35 %
Lymphs Abs: 1.9 10*3/uL (ref 0.7–4.0)
MCH: 25.3 pg — ABNORMAL LOW (ref 26.0–34.0)
MCHC: 31.7 g/dL (ref 30.0–36.0)
MCV: 79.9 fL (ref 78.0–100.0)
Monocytes Absolute: 0.4 10*3/uL (ref 0.1–1.0)
Monocytes Relative: 7 %
Neutro Abs: 2.9 10*3/uL (ref 1.7–7.7)
Neutrophils Relative %: 52 %
Platelets: 153 10*3/uL (ref 150–400)
RBC: 4.98 MIL/uL (ref 4.22–5.81)
RDW: 15 % (ref 11.5–15.5)
WBC: 5.5 10*3/uL (ref 4.0–10.5)

## 2015-01-12 LAB — I-STAT TROPONIN, ED
TROPONIN I, POC: 0.03 ng/mL (ref 0.00–0.08)
Troponin i, poc: 0.03 ng/mL (ref 0.00–0.08)

## 2015-01-12 LAB — BASIC METABOLIC PANEL
ANION GAP: 6 (ref 5–15)
BUN: 12 mg/dL (ref 6–20)
CALCIUM: 9.3 mg/dL (ref 8.9–10.3)
CHLORIDE: 106 mmol/L (ref 101–111)
CO2: 27 mmol/L (ref 22–32)
CREATININE: 0.83 mg/dL (ref 0.61–1.24)
GFR calc Af Amer: >60 mL/min (ref >60)
GFR calc non Af Amer: >60 mL/min (ref >60)
Glucose, Bld: 135 mg/dL — ABNORMAL HIGH (ref 65–99)
POTASSIUM: 3.6 mmol/L (ref 3.5–5.1)
Sodium: 139 mmol/L (ref 135–145)

## 2015-01-12 LAB — D-DIMER, QUANTITATIVE: D-Dimer, Quant: 0.39 ug{FEU}/mL (ref 0.00–0.48)

## 2015-01-12 MED ORDER — ALBUTEROL SULFATE HFA 108 (90 BASE) MCG/ACT IN AERS
2.0000 | INHALATION_SPRAY | Freq: Once | RESPIRATORY_TRACT | Status: AC
  Administered 2015-01-12: 2 via RESPIRATORY_TRACT
  Filled 2015-01-12: qty 6.7

## 2015-01-12 MED ORDER — ALBUTEROL SULFATE (2.5 MG/3ML) 0.083% IN NEBU
5.0000 mg | INHALATION_SOLUTION | Freq: Once | RESPIRATORY_TRACT | Status: AC
  Administered 2015-01-12: 5 mg via RESPIRATORY_TRACT
  Filled 2015-01-12: qty 6

## 2015-01-12 MED ORDER — AZITHROMYCIN 250 MG PO TABS: 250.0000 mg | ORAL_TABLET | Freq: Every day | ORAL | Status: DC

## 2015-01-12 NOTE — ED Notes (Signed)
Notified Dr Fredderick PhenixBelfi of pt's BP. Pt is currently asymptomatic.

## 2015-01-12 NOTE — ED Provider Notes (Signed)
CSN: [id number]     Arrival date & time 01/12/15  1710 History   First MD Initiated Contact with Patient 01/12/15 1732     Chief Complaint  Patient presents with  . Cough  . Nasal Congestion     (Consider location/radiation/quality/duration/timing/severity/associated sxs/prior Treatment) HPI  54 year old male presents with cough and chest congestion for the past 4 days. States that today he developed chest tightness and some shortness of breath. Patient states he smokes "a little bit". He denies any known lung problems. He states his cough has been productive with tan sputum but denies any hemoptysis. Felt night sweats last night but has not documented a fever. States he is able to a bili without dyspnea. No leg swelling. The patient has had some nasal congestion as well. He also complains of chronic right thumb pain that has been ongoing for the past 3-4 years. States sometimes when bending his thumb it hurts but has not noticed any swelling. Typically he gets a cortisone shot by his doctor that usually relieves the pain.  Past Medical History  Diagnosis Date  . Diabetes mellitus without complication (HCC)    History reviewed. No pertinent past surgical history. History reviewed. No pertinent family history. Social History  Substance Use Topics  . Smoking status: Former Smoker    Quit date: 01/22/2006  . Smokeless tobacco: Never Used  . Alcohol Use: Yes     Comment: occasionally    Review of Systems  HENT: Positive for congestion.   Respiratory: Positive for cough, chest tightness and shortness of breath.   Cardiovascular: Negative for leg swelling.  Gastrointestinal: Negative for vomiting.  All other systems reviewed and are negative.     Allergies  Review of patient's allergies indicates no known allergies.  Home Medications   Prior to Admission medications   Medication Sig Start Date End Date Taking? Authorizing Provider  ferrous sulfate 325 (65 FE) MG tablet Take  325 mg by mouth daily with breakfast.   Yes Historical Provider, MD  gemfibrozil (LOPID) 600 MG tablet Take 600 mg by mouth 2 (two) times daily.   Yes Historical Provider, MD  glipiZIDE (GLUCOTROL XL) 10 MG 24 hr tablet Take 10 mg by mouth daily.   Yes Historical Provider, MD  lisinopril-hydrochlorothiazide (PRINZIDE,ZESTORETIC) 20-12.5 MG per tablet Take 1 tablet by mouth daily.   Yes Historical Provider, MD  metFORMIN (GLUCOPHAGE) 500 MG tablet Take 1,000 mg by mouth 2 (two) times daily with a meal.   Yes Historical Provider, MD   BP 192/108 mmHg  Pulse 108  Temp(Src) 98.3 F (36.8 C) (Oral)  Resp 18  Ht 6' 2.5" (1.892 m)  Wt 265 lb (120.203 kg)  BMI 33.58 kg/m2  SpO2 97% Physical Exam  Constitutional: He is oriented to person, place, and time. He appears well-developed and well-nourished. No distress.  HENT:  Head: Normocephalic and atraumatic.  Right Ear: External ear normal.  Left Ear: External ear normal.  Nose: Nose normal.  Eyes: Right eye exhibits no discharge. Left eye exhibits no discharge.  Neck: Neck supple.  Cardiovascular: Normal rate, regular rhythm, normal heart sounds and intact distal pulses.   Pulmonary/Chest: Effort normal. He has wheezes (diffuse expiratory wheezes).  Abdominal: Soft. He exhibits no distension. There is no tenderness.  Musculoskeletal: He exhibits no edema.  Right thumb with normal ROM, no swelling or focal tenderness  Neurological: He is alert and oriented to person, place, and time.  Skin: Skin is warm and dry. He is not diaphoretic.  Nursing note and vitals reviewed.   ED Course  Procedures (including critical care time) Labs Review Labs Reviewed  BASIC METABOLIC PANEL - Abnormal; Notable for the following:    Glucose, Bld 135 (*)    All other components within normal limits  CBC WITH DIFFERENTIAL/PLATELET - Abnormal; Notable for the following:    Hemoglobin 12.6 (*)    MCH 25.3 (*)    All other components within normal limits   D-DIMER, QUANTITATIVE (NOT AT [facility])  Rosezena SensorI-STAT TROPOININ, ED  Rosezena SensorI-STAT TROPOININ, ED    Imaging Review Dg Chest 2 View  01/12/2015  CLINICAL DATA:  Chest pain with cough and congestion 1 week. EXAM: CHEST  2 VIEW COMPARISON:  01/23/2012 FINDINGS: Lungs are adequately inflated without consolidation or effusion. Mild stable cardiomegaly. Stable irregularity over the right posterior costovertebral junction. IMPRESSION: No acute cardiopulmonary disease. Electronically Signed   By: Elberta Fortisaniel  Boyle M.D.   On: 01/12/2015 18:07   I have personally reviewed and evaluated these images and lab results as part of my medical decision-making.   EKG Interpretation   Date/Time:  Sunday January 12 2015 18:04:45 EST Ventricular Rate:  93 PR Interval:  141 QRS Duration: 88 QT Interval:  367 QTC Calculation: 456 R Axis:   16 Text Interpretation:  Sinus rhythm Repol abnrm suggests ischemia, lateral  leads Changes noted compared to 2013 Confirmed by Vinita Prentiss  MD, Clarence Cogswell  ([id number]) on 01/12/2015 6:08:13 PM       EKG Interpretation  Date/Time:  Sunday January 12 2015 22:12:28 EST Ventricular Rate:  103 PR Interval:  150 QRS Duration: 85 QT Interval:  346 QTC Calculation: 453 R Axis:   37 Text Interpretation:  Sinus tachycardia Probable LVH with secondary repol abnrm no significant change since earlier in the day Confirmed by Viann Nielson  MD, Lodema Parma ([id number]) on 01/12/2015 10:16:18 PM       MDM   Final diagnoses:  Bronchitis  Essential hypertension    Patient's presentation is consistent with bronchitis. He was given one albuterol treatment and now states his chest tightness and shortness of breath have completely resolved. Due to this, I think his chest tightness is most likely bronchitic related and highly doubt ACS or pulmonary embolism. He is low risk for pulmonary embolus but given his tachycardia d-dimer was sent and is negative. Initial troponin negative, repeat troponin 3 hours later also the same  and negative. EKG shows T-wave inversions but I think this is consistent with LVH and not ischemia. His chest tightness is gone and thus I feel he can be treated as bronchitis and follow-up closely with his PCP in 1-2 days for better blood pressure control and close follow-up. Discussed strict return precautions.    Pricilla LovelessScott Thoms Barthelemy, MD 01/12/15 [phone]

## 2015-01-12 NOTE — ED Notes (Signed)
Pt denies headache, dizziness, palpitations despite high BP. Provider notified.

## 2015-01-12 NOTE — Discharge Instructions (Signed)
If your shortness of breath worsens/returns or you develop chest pain or tightness come back to the ER immediately or see your doctor. Either way, call your doctor tomorrow for a close follow up and adjustment of your high blood pressure medicines. Use the albuterol inhaler given to you 2 puffs every 4 hours as needed for shortness of breath or wheezing. If you need more than this, return to the ER.

## 2015-01-12 NOTE — ED Notes (Signed)
Pt presents with cough (productive tan sputum) and nasal congestion x4 days with chest tightness d/t coughing. Has attempted using Tylenol cold medication without alleviation of symptoms. Also c/o right thumb pain and tightness. No other c/c. Ambulatory without dyspnea. RR even/unlabored.

## 2015-02-15 ENCOUNTER — Observation Stay (HOSPITAL_COMMUNITY)
Admission: EM | Admit: 2015-02-15 | Discharge: 2015-02-17 | Disposition: A | Discharge status: Home / Self Care | Payer: Self-pay | Attending: Internal Medicine | Admitting: Internal Medicine

## 2015-02-15 ENCOUNTER — Encounter (HOSPITAL_COMMUNITY): Payer: Self-pay | Admitting: Oncology

## 2015-02-15 ENCOUNTER — Emergency Department (HOSPITAL_COMMUNITY): Payer: Self-pay

## 2015-02-15 DIAGNOSIS — E119 Type 2 diabetes mellitus without complications: Secondary | ICD-10-CM

## 2015-02-15 DIAGNOSIS — J40 Bronchitis, not specified as acute or chronic: Secondary | ICD-10-CM | POA: Insufficient documentation

## 2015-02-15 DIAGNOSIS — Z7984 Long term (current) use of oral hypoglycemic drugs: Secondary | ICD-10-CM | POA: Insufficient documentation

## 2015-02-15 DIAGNOSIS — I10 Essential (primary) hypertension: Secondary | ICD-10-CM | POA: Diagnosis present

## 2015-02-15 DIAGNOSIS — Z79899 Other long term (current) drug therapy: Secondary | ICD-10-CM | POA: Insufficient documentation

## 2015-02-15 DIAGNOSIS — R079 Chest pain, unspecified: Principal | ICD-10-CM | POA: Diagnosis present

## 2015-02-15 DIAGNOSIS — Z87891 Personal history of nicotine dependence: Secondary | ICD-10-CM | POA: Insufficient documentation

## 2015-02-15 DIAGNOSIS — Z23 Encounter for immunization: Secondary | ICD-10-CM | POA: Insufficient documentation

## 2015-02-15 HISTORY — DX: Essential (primary) hypertension: I10

## 2015-02-15 LAB — BASIC METABOLIC PANEL
ANION GAP: 7 (ref 5–15)
BUN: 15 mg/dL (ref 6–20)
CHLORIDE: 107 mmol/L (ref 101–111)
CO2: 28 mmol/L (ref 22–32)
Calcium: 9 mg/dL (ref 8.9–10.3)
Creatinine, Ser: 0.9 mg/dL (ref 0.61–1.24)
GFR calc Af Amer: >60 mL/min (ref >60)
GFR calc non Af Amer: >60 mL/min (ref >60)
Glucose, Bld: 112 mg/dL — ABNORMAL HIGH (ref 65–99)
POTASSIUM: 3.6 mmol/L (ref 3.5–5.1)
SODIUM: 142 mmol/L (ref 135–145)

## 2015-02-15 LAB — CBC WITH DIFFERENTIAL/PLATELET
Basophils Absolute: 0 10*3/uL (ref 0.0–0.1)
Basophils Relative: 1 %
EOS ABS: 0.2 10*3/uL (ref 0.0–0.7)
Eosinophils Relative: 4 %
HCT: 38.4 % — ABNORMAL LOW (ref 39.0–52.0)
HEMOGLOBIN: 12.1 g/dL — AB (ref 13.0–17.0)
LYMPHS ABS: 1.8 10*3/uL (ref 0.7–4.0)
LYMPHS PCT: 32 %
MCH: 25.4 pg — AB (ref 26.0–34.0)
MCHC: 31.5 g/dL (ref 30.0–36.0)
MCV: 80.7 fL (ref 78.0–100.0)
Monocytes Absolute: 0.4 10*3/uL (ref 0.1–1.0)
Monocytes Relative: 7 %
NEUTROS PCT: 57 %
Neutro Abs: 3.2 10*3/uL (ref 1.7–7.7)
Platelets: 143 10*3/uL — ABNORMAL LOW (ref 150–400)
RBC: 4.76 MIL/uL (ref 4.22–5.81)
RDW: 15.3 % (ref 11.5–15.5)
WBC: 5.6 10*3/uL (ref 4.0–10.5)

## 2015-02-15 LAB — I-STAT TROPONIN, ED: TROPONIN I, POC: 0.04 ng/mL (ref 0.00–0.08)

## 2015-02-15 LAB — BRAIN NATRIURETIC PEPTIDE: B NATRIURETIC PEPTIDE 5: 163.5 pg/mL — AB (ref 0.0–100.0)

## 2015-02-15 MED ORDER — HYDROCHLOROTHIAZIDE 12.5 MG PO CAPS
12.5000 mg | ORAL_CAPSULE | Freq: Every day | ORAL | Status: DC
  Administered 2015-02-16 – 2015-02-17 (×2): 12.5 mg via ORAL
  Filled 2015-02-15 (×2): qty 1

## 2015-02-15 MED ORDER — IPRATROPIUM-ALBUTEROL 0.5-2.5 (3) MG/3ML IN SOLN
3.0000 mL | Freq: Once | RESPIRATORY_TRACT | Status: AC
  Administered 2015-02-15: 3 mL via RESPIRATORY_TRACT
  Filled 2015-02-15: qty 3

## 2015-02-15 MED ORDER — LISINOPRIL 20 MG PO TABS
20.0000 mg | ORAL_TABLET | Freq: Every day | ORAL | Status: DC
  Administered 2015-02-16 – 2015-02-17 (×2): 20 mg via ORAL
  Filled 2015-02-15 (×2): qty 1

## 2015-02-15 MED ORDER — ENOXAPARIN SODIUM 40 MG/0.4ML ~~LOC~~ SOLN
40.0000 mg | SUBCUTANEOUS | Status: DC
  Administered 2015-02-15 – 2015-02-16 (×2): 40 mg via SUBCUTANEOUS
  Filled 2015-02-15 (×2): qty 0.4

## 2015-02-15 MED ORDER — ACETAMINOPHEN 325 MG PO TABS: 650.0000 mg | ORAL_TABLET | ORAL | Status: DC | PRN

## 2015-02-15 MED ORDER — INSULIN ASPART 100 UNIT/ML ~~LOC~~ SOLN
0.0000 [IU] | SUBCUTANEOUS | Status: DC
  Administered 2015-02-16: 3 [IU] via SUBCUTANEOUS
  Administered 2015-02-16: 5 [IU] via SUBCUTANEOUS
  Administered 2015-02-16 (×2): 2 [IU] via SUBCUTANEOUS
  Administered 2015-02-16: 3 [IU] via SUBCUTANEOUS
  Administered 2015-02-16: 1 [IU] via SUBCUTANEOUS
  Administered 2015-02-16 – 2015-02-17 (×2): 2 [IU] via SUBCUTANEOUS
  Administered 2015-02-17: 1 [IU] via SUBCUTANEOUS

## 2015-02-15 MED ORDER — GEMFIBROZIL 600 MG PO TABS
600.0000 mg | ORAL_TABLET | Freq: Two times a day (BID) | ORAL | Status: DC
  Administered 2015-02-16 – 2015-02-17 (×3): 600 mg via ORAL
  Filled 2015-02-15 (×4): qty 1

## 2015-02-15 MED ORDER — PREDNISONE 20 MG PO TABS
60.0000 mg | ORAL_TABLET | Freq: Once | ORAL | Status: AC
  Administered 2015-02-15: 60 mg via ORAL
  Filled 2015-02-15: qty 3

## 2015-02-15 MED ORDER — FERROUS SULFATE 325 (65 FE) MG PO TABS
325.0000 mg | ORAL_TABLET | Freq: Every day | ORAL | Status: DC
  Administered 2015-02-16 – 2015-02-17 (×2): 325 mg via ORAL
  Filled 2015-02-15 (×3): qty 1

## 2015-02-15 MED ORDER — ONDANSETRON HCL 4 MG/2ML IJ SOLN: 4.0000 mg | Freq: Four times a day (QID) | INTRAMUSCULAR | Status: DC | PRN

## 2015-02-15 MED ORDER — ASPIRIN 81 MG PO CHEW
324.0000 mg | CHEWABLE_TABLET | Freq: Once | ORAL | Status: AC
  Administered 2015-02-15: 324 mg via ORAL
  Filled 2015-02-15: qty 4

## 2015-02-15 MED ORDER — LISINOPRIL-HYDROCHLOROTHIAZIDE 20-12.5 MG PO TABS: 1.0000 | ORAL_TABLET | Freq: Every day | ORAL | Status: DC

## 2015-02-15 NOTE — H&P (Signed)
[facility] History and Physical  Charles Salinashillip O Thornberry ZOX:[id number] DOB: 12-05-1960 DOA: 02/15/2015  Referring physician: EDP PCP: Pcp Not In System   Chief Complaint: Chest pain   HPI: Charles Travis is a 54 y.o. male who presents to the ED with c/o chest tightness.  Symptoms have been ongoing for the past 2 weeks.  Started with cough and sinusitis which resolved yesterday.  He still has SOB and chest tightness with activity.  He does have cardiac risk factors including DM, HTN, quit smoking 3 months ago, has sedentary lifestyle.  No h/o HLD nor family history of early onset CAD he states.  Review of Systems: Systems reviewed.  As above, otherwise negative  Past Medical History  Diagnosis Date  . Diabetes mellitus without complication (HCC)    History reviewed. No pertinent past surgical history. Social History:  reports that he quit smoking about 9 years ago. He has never used smokeless tobacco. He reports that he drinks alcohol. He reports that he does not use illicit drugs.  No Known Allergies  History reviewed. No pertinent family history.   Prior to Admission medications   Medication Sig Start Date End Date Taking? Authorizing Provider  dextromethorphan-guaiFENesin (MUCINEX DM) 30-600 MG 12hr tablet Take 2 tablets by mouth 2 (two) times daily as needed for cough (cold symptoms).   Yes Historical Provider, MD  ferrous sulfate 325 (65 FE) MG tablet Take 325 mg by mouth daily with breakfast.   Yes Historical Provider, MD  gemfibrozil (LOPID) 600 MG tablet Take 600 mg by mouth 2 (two) times daily.   Yes Historical Provider, MD  glipiZIDE (GLUCOTROL XL) 10 MG 24 hr tablet Take 10 mg by mouth daily.   Yes Historical Provider, MD  lisinopril-hydrochlorothiazide (PRINZIDE,ZESTORETIC) 20-12.5 MG per tablet Take 1 tablet by mouth daily.   Yes Historical Provider, MD  Menthol-Methyl Salicylate (ICY HOT) 10-30 % STCK Apply 1 application topically daily as needed (joint pain).    Yes Historical Provider, MD  metFORMIN (GLUCOPHAGE) 500 MG tablet Take 1,000 mg by mouth 2 (two) times daily with a meal.   Yes Historical Provider, MD  PE-DM-APAP & Doxylamin-DM-APAP (VICKS DAYQUIL/NYQUIL CLD & FLU) (LIQUID) MISC Take 1 Dose by mouth at bedtime as needed (cold symptoms).   Yes Historical Provider, MD   Physical Exam: Filed Vitals:   02/15/15 1919 02/15/15 2246  BP: 170/90 163/103  Pulse: 96 96  Temp: 98 F (36.7 C) 97.9 F (36.6 C)  Resp: 16 16    BP 163/103 mmHg  Pulse 96  Temp(Src) 97.9 F (36.6 C) (Oral)  Resp 16  Ht 6\' 2"  (1.88 m)  Wt 122.471 kg (270 lb)  BMI 34.65 kg/m2  SpO2 98%  General Appearance:    Alert, oriented, no distress, appears stated age  Head:    Normocephalic, atraumatic  Eyes:    PERRL, EOMI, sclera non-icteric        Nose:   Nares without drainage or epistaxis. Mucosa, turbinates normal  Throat:   Moist mucous membranes. Oropharynx without erythema or exudate.  Neck:   Supple. No carotid bruits.  No thyromegaly.  No lymphadenopathy.   Back:     No CVA tenderness, no spinal tenderness  Lungs:     Clear to auscultation bilaterally, without wheezes, rhonchi or rales  Chest wall:    No tenderness to palpitation  Heart:    Regular rate and rhythm without murmurs, gallops, rubs  Abdomen:     Soft, non-tender, nondistended, normal bowel  sounds, no organomegaly  Genitalia:    deferred  Rectal:    deferred  Extremities:   No clubbing, cyanosis or edema.  Pulses:   2+ and symmetric all extremities  Skin:   Skin color, texture, turgor normal, no rashes or lesions  Lymph nodes:   Cervical, supraclavicular, and axillary nodes normal  Neurologic:   CNII-XII intact. Normal strength, sensation and reflexes      throughout    Labs on Admission:  Basic Metabolic Panel:  Recent Labs Lab 02/15/15 2028  NA 142  K 3.6  CL 107  CO2 28  GLUCOSE 112*  BUN 15  CREATININE 0.90  CALCIUM 9.0   Liver Function Tests: No results for input(s):  AST, ALT, ALKPHOS, BILITOT, PROT, ALBUMIN in the last 168 hours. No results for input(s): LIPASE, AMYLASE in the last 168 hours. No results for input(s): AMMONIA in the last 168 hours. CBC:  Recent Labs Lab 02/15/15 2028  WBC 5.6  NEUTROABS 3.2  HGB 12.1*  HCT 38.4*  MCV 80.7  PLT 143*   Cardiac Enzymes: No results for input(s): CKTOTAL, CKMB, CKMBINDEX, TROPONINI in the last 168 hours.  BNP (last 3 results) No results for input(s): PROBNP in the last 8760 hours. CBG: No results for input(s): GLUCAP in the last 168 hours.  Radiological Exams on Admission: Dg Chest 2 View  02/15/2015  CLINICAL DATA:  Shortness of breath, sinus drainage, increased tiredness, feeling winded for 2 weeks, bronchitis, hypertension, diabetes mellitus, former smoker EXAM: CHEST  2 VIEW COMPARISON:  01/12/2015 FINDINGS: Enlargement of cardiac silhouette. Tortuous aorta. Mediastinal contours and pulmonary vascularity normal. Bronchitic changes with small bibasilar effusions and minimal basilar atelectasis. No definite acute infiltrate or pneumothorax. Bones unremarkable. IMPRESSION: Enlargement of cardiac silhouette. Bronchitic changes with mild bibasilar atelectasis and tiny pleural effusions bilaterally. Electronically Signed   By: Ulyses Southward M.D.   On: 02/15/2015 19:54    EKG: Independently reviewed.  Assessment/Plan Principal Problem:   Chest pain with moderate risk for cardiac etiology Active Problems:   DM2 (diabetes mellitus, type 2) (HCC)   HTN (hypertension)   1. Chest pain, mod risk for cardiac etiology - HEART score of 5 1. Admit to tele 2. Serial trops 3. NPO after midnight 4. Call cards for possible stress test in AM. 2. HTN - continue home meds 3. DM2 - low dose SSI and hold home metformin / glipizide while NPO.    Code Status: Full Code  Family Communication: no family in room Disposition Plan: Admit to obs  Time spent: 50 min  GARDNER, JARED M. [facility] Pager  [phone]  If 7AM-7PM, please contact the day team taking care of the patient [email or web address] Password [email or web address] 02/15/2015, 11:07 PM

## 2015-02-15 NOTE — ED Provider Notes (Signed)
CSN: [id number]     Arrival date & time 02/15/15  1910 History   First MD Initiated Contact with Patient 02/15/15 1943     Chief Complaint  Patient presents with  . Shortness of Breath     (Consider location/radiation/quality/duration/timing/severity/associated sxs/prior Treatment) HPI Charles Travis is a 54 y.o. male with history of diabetes, presents to emergency department complaining of chest pain and shortness of breath. Patient states that he has had symptoms for approximately 2 weeks. He states that he has been having some sinus drainage, mild cough, that now resolved. He states "my cough is not that bad." He states that he gets short of breath and develops chest tightness with any exertional activity. He states he has to stop and rest to catch his breath. He denies any fever or chills. He denies any symptoms at this time. He denies any wheezing. He has been taking DayQuil and Mucinex for his symptoms. He states he quit smoking 3 months ago, after smoking for 10 years. He denies any cardiac history. Denies any associated dizziness or diaphoresis. He has no other complaints.  Past Medical History  Diagnosis Date  . Diabetes mellitus without complication (HCC)    History reviewed. No pertinent past surgical history. History reviewed. No pertinent family history. Social History  Substance Use Topics  . Smoking status: Former Smoker    Quit date: 01/22/2006  . Smokeless tobacco: Never Used  . Alcohol Use: Yes     Comment: occasionally    Review of Systems  Constitutional: Negative for fever and chills.  HENT: Positive for congestion.   Respiratory: Positive for cough, chest tightness and shortness of breath.   Cardiovascular: Positive for chest pain. Negative for palpitations and leg swelling.  Gastrointestinal: Negative for nausea, vomiting, abdominal pain, diarrhea and abdominal distention.  Musculoskeletal: Negative for myalgias, arthralgias, neck pain and neck stiffness.   Skin: Negative for rash.  Allergic/Immunologic: Negative for immunocompromised state.  Neurological: Negative for dizziness, weakness, light-headedness, numbness and headaches.  All other systems reviewed and are negative.     Allergies  Review of patient's allergies indicates no known allergies.  Home Medications   Prior to Admission medications   Medication Sig Start Date End Date Taking? Authorizing Provider  dextromethorphan-guaiFENesin (MUCINEX DM) 30-600 MG 12hr tablet Take 2 tablets by mouth 2 (two) times daily as needed for cough (cold symptoms).   Yes Historical Provider, MD  ferrous sulfate 325 (65 FE) MG tablet Take 325 mg by mouth daily with breakfast.   Yes Historical Provider, MD  gemfibrozil (LOPID) 600 MG tablet Take 600 mg by mouth 2 (two) times daily.   Yes Historical Provider, MD  glipiZIDE (GLUCOTROL XL) 10 MG 24 hr tablet Take 10 mg by mouth daily.   Yes Historical Provider, MD  lisinopril-hydrochlorothiazide (PRINZIDE,ZESTORETIC) 20-12.5 MG per tablet Take 1 tablet by mouth daily.   Yes Historical Provider, MD  Menthol-Methyl Salicylate (ICY HOT) 10-30 % STCK Apply 1 application topically daily as needed (joint pain).   Yes Historical Provider, MD  metFORMIN (GLUCOPHAGE) 500 MG tablet Take 1,000 mg by mouth 2 (two) times daily with a meal.   Yes Historical Provider, MD  PE-DM-APAP & Doxylamin-DM-APAP (VICKS DAYQUIL/NYQUIL CLD & FLU) (LIQUID) MISC Take 1 Dose by mouth at bedtime as needed (cold symptoms).   Yes Historical Provider, MD  azithromycin (ZITHROMAX) 250 MG tablet Take 1 tablet (250 mg total) by mouth daily. Take first 2 tablets together, then 1 every day until finished. 01/12/15  Pricilla Loveless, MD   BP 170/90 mmHg  Pulse 96  Temp(Src) 98 F (36.7 C) (Oral)  Resp 16  Ht  (1.88 m)  Wt 122.471 kg  BMI 34.65 kg/m2  SpO2 98% Physical Exam  Constitutional: He appears well-developed and well-nourished. No distress.  HENT:  Head: Normocephalic and  atraumatic.  Eyes: Conjunctivae are normal.  Neck: Neck supple.  Cardiovascular: Normal rate, regular rhythm and normal heart sounds.   Pulmonary/Chest: Effort normal. No respiratory distress. He has no wheezes. He has no rales.  Diminished breath sounds bilaterally  Abdominal: Soft. Bowel sounds are normal. He exhibits no distension. There is no tenderness. There is no rebound.  Musculoskeletal: He exhibits no edema.  Neurological: He is alert.  Skin: Skin is warm and dry.  Nursing note and vitals reviewed.   ED Course  Procedures (including critical care time) Labs Review Labs Reviewed  CBC WITH DIFFERENTIAL/PLATELET - Abnormal; Notable for the following:    Hemoglobin 12.1 (*)    HCT 38.4 (*)    MCH 25.4 (*)    Platelets 143 (*)    All other components within normal limits  BASIC METABOLIC PANEL - Abnormal; Notable for the following:    Glucose, Bld 112 (*)    All other components within normal limits  BRAIN NATRIURETIC PEPTIDE - Abnormal; Notable for the following:    B Natriuretic Peptide 163.5 (*)    All other components within normal limits  I-STAT TROPOININ, ED    Imaging Review Dg Chest 2 View  02/15/2015  CLINICAL DATA:  Shortness of breath, sinus drainage, increased tiredness, feeling winded for 2 weeks, bronchitis, hypertension, diabetes mellitus, former smoker EXAM: CHEST  2 VIEW COMPARISON:  01/12/2015 FINDINGS: Enlargement of cardiac silhouette. Tortuous aorta. Mediastinal contours and pulmonary vascularity normal. Bronchitic changes with small bibasilar effusions and minimal basilar atelectasis. No definite acute infiltrate or pneumothorax. Bones unremarkable. IMPRESSION: Enlargement of cardiac silhouette. Bronchitic changes with mild bibasilar atelectasis and tiny pleural effusions bilaterally. Electronically Signed   By: Ulyses Southward M.D.   On: 02/15/2015 19:54   I have personally reviewed and evaluated these images and lab results as part of my medical  decision-making.   EKG Interpretation   Date/Time:  Saturday February 15 2015 19:36:12 EST Ventricular Rate:  111 PR Interval:  140 QRS Duration: 85 QT Interval:  328 QTC Calculation: 446 R Axis:   39 Text Interpretation:  Sinus arrhythmia Ventricular premature complex  Aberrant complex Probable left atrial enlargement Probable left  ventricular hypertrophy Nonspecific T abnrm, anterolateral leads No  significant change since last tracing Confirmed by [facility] MD, ERIN  ([id number]) on 02/15/2015 9:39:47 PM      MDM   Final diagnoses:  Chest pain, unspecified chest pain type   Pt with sob, chest tightness, mainly on exertion. Heart score of 4. Denies cough or congestion at this time but states he had it initially. Pt is hypertensive, otherwise normal VS. Will check labs, CXR.   Labs and CXR with no acute findings. Pt's symptoms concerning for possible ACS. Pt with multiple rick factors, with no prior cardiac work up per pt. Discussed with Dr. Dalene Seltzer, will admit to [facility] for observation.   Spoke with Dr. Julian Reil, will admit. Aspirin given. Pt is pain free.   Filed Vitals:   02/15/15 1919 02/15/15 2246  BP: 170/90 163/103  Pulse: 96 96  Temp: 98 F (36.7 C) 97.9 F (36.6 C)  Resp: 16 16     Kewanna Kasprzak,  PA-C 02/15/15 [id number]  Alvira MondayErin Schlossman, MD 02/20/15 1358

## 2015-02-15 NOTE — ED Notes (Signed)
Per pt he has been having sinus drainage, cough, SOB and body aches x 2 weeks.  Pt has been using OTC cold medicine w/o significant relief.  Pt is speaking in full sentences w/o difficulty.

## 2015-02-15 NOTE — Progress Notes (Signed)
Slight delay in transport due to shift change and oncoming nurse addressing blood pressure.

## 2015-02-16 DIAGNOSIS — E1169 Type 2 diabetes mellitus with other specified complication: Secondary | ICD-10-CM

## 2015-02-16 DIAGNOSIS — I10 Essential (primary) hypertension: Secondary | ICD-10-CM

## 2015-02-16 DIAGNOSIS — R079 Chest pain, unspecified: Secondary | ICD-10-CM

## 2015-02-16 LAB — GLUCOSE, CAPILLARY
GLUCOSE-CAPILLARY: 187 mg/dL — AB (ref 65–99)
GLUCOSE-CAPILLARY: 215 mg/dL — AB (ref 65–99)
Glucose-Capillary: 133 mg/dL — ABNORMAL HIGH (ref 65–99)
Glucose-Capillary: 168 mg/dL — ABNORMAL HIGH (ref 65–99)
Glucose-Capillary: 184 mg/dL — ABNORMAL HIGH (ref 65–99)
Glucose-Capillary: 205 mg/dL — ABNORMAL HIGH (ref 65–99)
Glucose-Capillary: 280 mg/dL — ABNORMAL HIGH (ref 65–99)

## 2015-02-16 LAB — TROPONIN I
Troponin I: 0.03 ng/mL
Troponin I: 0.04 ng/mL — ABNORMAL HIGH

## 2015-02-16 MED ORDER — HYDRALAZINE HCL 20 MG/ML IJ SOLN
10.0000 mg | Freq: Four times a day (QID) | INTRAMUSCULAR | Status: DC | PRN
  Administered 2015-02-16: 10 mg via INTRAVENOUS
  Filled 2015-02-16: qty 1

## 2015-02-16 MED ORDER — PREDNISONE 50 MG PO TABS
50.0000 mg | ORAL_TABLET | Freq: Every day | ORAL | Status: DC
  Administered 2015-02-17: 50 mg via ORAL
  Filled 2015-02-16: qty 1

## 2015-02-16 MED ORDER — PREDNISONE 20 MG PO TABS
60.0000 mg | ORAL_TABLET | Freq: Every day | ORAL | Status: DC
  Administered 2015-02-16: 60 mg via ORAL
  Filled 2015-02-16: qty 3

## 2015-02-16 MED ORDER — AMLODIPINE BESYLATE 10 MG PO TABS
10.0000 mg | ORAL_TABLET | Freq: Every day | ORAL | Status: DC
Start: 2015-02-16 — End: 2015-02-17
  Administered 2015-02-16 – 2015-02-17 (×2): 10 mg via ORAL
  Filled 2015-02-16 (×2): qty 1

## 2015-02-16 MED ORDER — PNEUMOCOCCAL VAC POLYVALENT 25 MCG/0.5ML IJ INJ
0.5000 mL | INJECTION | INTRAMUSCULAR | Status: AC
Start: 2015-02-17 — End: 2015-02-17
  Administered 2015-02-17: 0.5 mL via INTRAMUSCULAR
  Filled 2015-02-16 (×2): qty 0.5

## 2015-02-16 MED ORDER — LEVOFLOXACIN 500 MG PO TABS
500.0000 mg | ORAL_TABLET | Freq: Every day | ORAL | Status: DC
  Administered 2015-02-16 – 2015-02-17 (×2): 500 mg via ORAL
  Filled 2015-02-16 (×2): qty 1

## 2015-02-16 MED ORDER — HYDRALAZINE HCL 20 MG/ML IJ SOLN
10.0000 mg | Freq: Once | INTRAMUSCULAR | Status: AC
  Administered 2015-02-16: 10 mg via INTRAVENOUS
  Filled 2015-02-16: qty 1

## 2015-02-16 MED ORDER — IPRATROPIUM-ALBUTEROL 0.5-2.5 (3) MG/3ML IN SOLN
3.0000 mL | Freq: Four times a day (QID) | RESPIRATORY_TRACT | Status: DC
  Administered 2015-02-16 – 2015-02-17 (×4): 3 mL via RESPIRATORY_TRACT
  Filled 2015-02-16 (×3): qty 3

## 2015-02-16 MED ORDER — INFLUENZA VAC SPLIT QUAD 0.5 ML IM SUSY
0.5000 mL | PREFILLED_SYRINGE | INTRAMUSCULAR | Status: AC
  Administered 2015-02-17: 0.5 mL via INTRAMUSCULAR
  Filled 2015-02-16 (×2): qty 0.5

## 2015-02-16 NOTE — Progress Notes (Signed)
[facility] PROGRESS NOTE  Charles Travis KGM:[id number] DOB: 1960-03-08 DOA: 02/15/2015 PCP: Pcp Not In System  Assessment/Plan: 1. Bronchitis with reactive airway disease -cough, congestion, wheezing, chest tightness -do not suspect ACS -cycle troponins, check ECHO, EKG WNL -prednisone, Levaquin, nebs  2. HTN - continue home meds  3. DM2  - low dose SSI and hold home metformin / glipizide while NPO.  DVT proph: lovenox  Code Status: Full Code  Family Communication: no family in room Disposition Plan: Admit to obs  HPI/Subjective: Feels better, occ wheezing  Objective: Filed Vitals:   02/16/15 0438 02/16/15 1240  BP: 161/87 174/108  Pulse: 103   Temp: 98.4 F (36.9 C)   Resp: 20    No intake or output data in the 24 hours ending 02/16/15 1325 Filed Weights   02/15/15 1919 02/15/15 2345  Weight: 122.471 kg (270 lb) 122.879 kg (270 lb 14.4 oz)    Exam:   General:  AAOx3  Cardiovascular: S1S2/RRR  Respiratory: ronchi, wheezes  Abdomen: soft, NT, BS present  Musculoskeletal: no edema   Data Reviewed: Basic Metabolic Panel:  Recent Labs Lab 02/15/15 2028  NA 142  K 3.6  CL 107  CO2 28  GLUCOSE 112*  BUN 15  CREATININE 0.90  CALCIUM 9.0   Liver Function Tests: No results for input(s): AST, ALT, ALKPHOS, BILITOT, PROT, ALBUMIN in the last 168 hours. No results for input(s): LIPASE, AMYLASE in the last 168 hours. No results for input(s): AMMONIA in the last 168 hours. CBC:  Recent Labs Lab 02/15/15 2028  WBC 5.6  NEUTROABS 3.2  HGB 12.1*  HCT 38.4*  MCV 80.7  PLT 143*   Cardiac Enzymes:  Recent Labs Lab 02/16/15 0824  TROPONINI 0.03   BNP (last 3 results)  Recent Labs  02/15/15 2028  BNP 163.5*    ProBNP (last 3 results) No results for input(s): PROBNP in the last 8760 hours.  CBG:  Recent Labs Lab 02/16/15 0018 02/16/15 0436 02/16/15 0806 02/16/15 1130  GLUCAP 168* 187* 133* 184*    No results found  for this or any previous visit (from the past 240 hour(s)).   Studies: Dg Chest 2 View  02/15/2015  CLINICAL DATA:  Shortness of breath, sinus drainage, increased tiredness, feeling winded for 2 weeks, bronchitis, hypertension, diabetes mellitus, former smoker EXAM: CHEST  2 VIEW COMPARISON:  01/12/2015 FINDINGS: Enlargement of cardiac silhouette. Tortuous aorta. Mediastinal contours and pulmonary vascularity normal. Bronchitic changes with small bibasilar effusions and minimal basilar atelectasis. No definite acute infiltrate or pneumothorax. Bones unremarkable. IMPRESSION: Enlargement of cardiac silhouette. Bronchitic changes with mild bibasilar atelectasis and tiny pleural effusions bilaterally. Electronically Signed   By: Ulyses Southward M.D.   On: 02/15/2015 19:54    Scheduled Meds: . enoxaparin (LOVENOX) injection  40 mg Subcutaneous Q24H  . ferrous sulfate  325 mg Oral Q breakfast  . gemfibrozil  600 mg Oral BID AC  . lisinopril  20 mg Oral Daily   And  . hydrochlorothiazide  12.5 mg Oral Daily  . [START ON 02/17/2015] Influenza vac split quadrivalent PF  0.5 mL Intramuscular Tomorrow-1000  . insulin aspart  0-9 Units Subcutaneous 6 times per day  . ipratropium-albuterol  3 mL Nebulization Q6H  . levofloxacin  500 mg Oral Daily  . [START ON 02/17/2015] pneumococcal 23 valent vaccine  0.5 mL Intramuscular Tomorrow-1000  . predniSONE  60 mg Oral Q breakfast   Continuous Infusions:  Antibiotics Given (last 72 hours)  Date/Time Action Medication Dose   02/16/15 1239 Given   levofloxacin (LEVAQUIN) tablet 500 mg 500 mg      Principal Problem:   Chest pain with moderate risk for cardiac etiology Active Problems:   DM2 (diabetes mellitus, type 2) (HCC)   HTN (hypertension)    Time spent: 35min    [facility] Medical CenterJOSEPH,Myleka Moncure  [facility] Pager [phone]. If 7PM-7AM, please contact night-coverage at [email or web address], password [email or web address] 02/16/2015, 1:25 PM  LOS: 1 day

## 2015-02-17 ENCOUNTER — Inpatient Hospital Stay (HOSPITAL_COMMUNITY): Payer: No Typology Code available for payment source

## 2015-02-17 DIAGNOSIS — R079 Chest pain, unspecified: Secondary | ICD-10-CM | POA: Diagnosis not present

## 2015-02-17 LAB — CBC
HCT: 37.7 % — ABNORMAL LOW (ref 39.0–52.0)
HEMOGLOBIN: 12.1 g/dL — AB (ref 13.0–17.0)
MCH: 25.2 pg — ABNORMAL LOW (ref 26.0–34.0)
MCHC: 32.1 g/dL (ref 30.0–36.0)
MCV: 78.5 fL (ref 78.0–100.0)
Platelets: 159 10*3/uL (ref 150–400)
RBC: 4.8 MIL/uL (ref 4.22–5.81)
RDW: 15.1 % (ref 11.5–15.5)
WBC: 8.2 10*3/uL (ref 4.0–10.5)

## 2015-02-17 LAB — BASIC METABOLIC PANEL
Anion gap: 8 (ref 5–15)
BUN: 22 mg/dL — AB (ref 6–20)
CHLORIDE: 106 mmol/L (ref 101–111)
CO2: 26 mmol/L (ref 22–32)
Calcium: 9.7 mg/dL (ref 8.9–10.3)
Creatinine, Ser: 1.02 mg/dL (ref 0.61–1.24)
Glucose, Bld: 179 mg/dL — ABNORMAL HIGH (ref 65–99)
POTASSIUM: 3.4 mmol/L — AB (ref 3.5–5.1)
SODIUM: 140 mmol/L (ref 135–145)

## 2015-02-17 LAB — GLUCOSE, CAPILLARY
GLUCOSE-CAPILLARY: 151 mg/dL — AB (ref 65–99)
Glucose-Capillary: 166 mg/dL — ABNORMAL HIGH (ref 65–99)
Glucose-Capillary: 131 mg/dL — ABNORMAL HIGH (ref 65–99)

## 2015-02-17 MED ORDER — AMLODIPINE BESYLATE 10 MG PO TABS: 10.0000 mg | ORAL_TABLET | Freq: Every day | ORAL | Status: DC

## 2015-02-17 MED ORDER — LEVOFLOXACIN 500 MG PO TABS: 500.0000 mg | ORAL_TABLET | Freq: Every day | ORAL | Status: DC

## 2015-02-17 MED ORDER — PREDNISONE 20 MG PO TABS: ORAL_TABLET | ORAL | Status: DC

## 2015-02-17 MED ORDER — ALBUTEROL SULFATE HFA 108 (90 BASE) MCG/ACT IN AERS: 2.0000 | INHALATION_SPRAY | Freq: Four times a day (QID) | RESPIRATORY_TRACT | Status: AC | PRN

## 2015-02-17 MED ORDER — UNABLE TO FIND: Status: DC

## 2015-02-17 MED ORDER — LEVALBUTEROL HCL 0.63 MG/3ML IN NEBU
0.6300 mg | INHALATION_SOLUTION | Freq: Three times a day (TID) | RESPIRATORY_TRACT | Status: DC
  Administered 2015-02-17: 0.63 mg via RESPIRATORY_TRACT
  Filled 2015-02-17: qty 3

## 2015-02-17 MED ORDER — ASPIRIN EC 81 MG PO TBEC: 81.0000 mg | DELAYED_RELEASE_TABLET | Freq: Every day | ORAL | Status: AC

## 2015-02-17 NOTE — Progress Notes (Signed)
*  PRELIMINARY RESULTS* Echocardiogram 2D Echocardiogram has been performed.  Jeryl Columbialliott, Synda Bagent 02/17/2015, 11:06 AM

## 2015-02-17 NOTE — Discharge Summary (Signed)
Physician Discharge Summary  Charles Travis RUE:[id number] DOB: [birth year] DOA: 02/15/2015  PCP: Pcp Not In System  Admit date: 02/15/2015 Discharge date: 02/17/2015  Time spent: 45 minutes  Recommendations for Outpatient Follow-up:  1. [facility]12[facility]15 at 9am 2. [facility], office will call with FU   Discharge Diagnoses:    Chest pain   Bronchitis with reactive airway disease   DM2 (diabetes mellitus, type 2) (HCC)   HTN (hypertension)   Pain in the chest   Discharge Condition: stable  Diet recommendation: low sodium, diabetic  Filed Weights   02/15/15 1919 02/15/15 2345  Weight: 122.471 kg (270 lb) 122.879 kg (270 lb 14.4 oz)    History of present illness:  Chief Complaint: Chest tightness HPI: Charles Travis is a 54 y.o. male who presents to the ED with c/o chest tightness. Symptoms have been ongoing for the past 2 weeks. Started with cough and sinusitis, then developed wheezing, still had SOB and chest tightness with activity. with cardiac risk factors including DM, HTN, quit smoking 3 months ago.  Hospital Course:  1. Bronchitis with reactive airway disease -admitted with cough, congestion, wheezing, chest tightness -ruled out for ACS, chest pain and tightness was felt to be pulmonary in origin -was a smoker, quit 429m ago, could have mild COPD, needs further eval -troponins negative, 2D ECHO with normal EF and wall motion, EKG WNL -improved with prednisone, Levaquin, nebs -discharged home on prednisone taper, Abx and albuterol inhaler -due to Cardiac risk factors, i called [facility] to request a FU, they will call pt with appt  2. HTN  - continue zestoretic, amlodipine added  3. DM2 - resumed metformin / glipizide at discharge  Discharge Exam: Filed Vitals:   02/17/15 0415 02/17/15 0934  BP: 142/80 161/98  Pulse: 107 114  Temp: 98.4 F (36.9 C)   Resp: 18     General: AAOx3 Cardiovascular:  S1S2/RRR Respiratory: improved air movement  Discharge Instructions    Current Discharge Medication List    START taking these medications   Details  amLODipine (NORVASC) 10 MG tablet Take 1 tablet (10 mg total) by mouth daily. Qty: 30 tablet, Refills: 0    aspirin EC 81 MG tablet Take 1 tablet (81 mg total) by mouth daily.    levofloxacin (LEVAQUIN) 500 MG tablet Take 1 tablet (500 mg total) by mouth daily. For 3days Qty: 3 tablet, Refills: 0    predniSONE (DELTASONE) 20 MG tablet 40mg  for 2days then 20mg  for 2days then STOP Qty: 6 tablet, Refills: 0    UNABLE TO FIND This note is to excuse Mr.Axzel Talmadge [employer] from work due to medical illness requiring hospitalization from 02/15/15 through 02/17/15, ok to return to work Qty: 1 each, Refills: 0      CONTINUE these medications which have NOT CHANGED   Details  dextromethorphan-guaiFENesin (MUCINEX DM) 30-600 MG 12hr tablet Take 2 tablets by mouth 2 (two) times daily as needed for cough (cold symptoms).    ferrous sulfate 325 (65 FE) MG tablet Take 325 mg by mouth daily with breakfast.    gemfibrozil (LOPID) 600 MG tablet Take 600 mg by mouth 2 (two) times daily.    glipiZIDE (GLUCOTROL XL) 10 MG 24 hr tablet Take 10 mg by mouth daily.    lisinopril-hydrochlorothiazide (PRINZIDE,ZESTORETIC) 20-12.5 MG per tablet Take 1 tablet by mouth daily.    Menthol-Methyl Salicylate (ICY HOT) 10-30 % STCK Apply 1 application topically daily as needed (joint  pain).    metFORMIN (GLUCOPHAGE) 500 MG tablet Take 1,000 mg by mouth 2 (two) times daily with a meal.    PE-DM-APAP & Doxylamin-DM-APAP (VICKS DAYQUIL/NYQUIL CLD & FLU) (LIQUID) MISC Take 1 Dose by mouth at bedtime as needed (cold symptoms).       No Known Allergies Follow-up Information    Follow up with [facility] On 03/12/2015.   Why:  Appointment at 09:15 AM.  Please keep appointment.    Contact information:   [address] [place]  [id number]       Follow up with [facility].   Why:  Office will call with FU       The results of significant diagnostics from this hospitalization (including imaging, microbiology, ancillary and laboratory) are listed below for reference.    Significant Diagnostic Studies: Dg Chest 2 View  02/15/2015  CLINICAL DATA:  Shortness of breath, sinus drainage, increased tiredness, feeling winded for 2 weeks, bronchitis, hypertension, diabetes mellitus, former smoker EXAM: CHEST  2 VIEW COMPARISON:  01/12/2015 FINDINGS: Enlargement of cardiac silhouette. Tortuous aorta. Mediastinal contours and pulmonary vascularity normal. Bronchitic changes with small bibasilar effusions and minimal basilar atelectasis. No definite acute infiltrate or pneumothorax. Bones unremarkable. IMPRESSION: Enlargement of cardiac silhouette. Bronchitic changes with mild bibasilar atelectasis and tiny pleural effusions bilaterally. Electronically Signed   By: Ulyses Southward M.D.   On: 02/15/2015 19:54    Microbiology: No results found for this or any previous visit (from the past 240 hour(s)).   Labs: Basic Metabolic Panel:  Recent Labs Lab 02/15/15 2028 02/17/15 0437  NA 142 140  K 3.6 3.4*  CL 107 106  CO2 28 26  GLUCOSE 112* 179*  BUN 15 22*  CREATININE 0.90 1.02  CALCIUM 9.0 9.7   Liver Function Tests: No results for input(s): AST, ALT, ALKPHOS, BILITOT, PROT, ALBUMIN in the last 168 hours. No results for input(s): LIPASE, AMYLASE in the last 168 hours. No results for input(s): AMMONIA in the last 168 hours. CBC:  Recent Labs Lab 02/15/15 2028 02/17/15 0437  WBC 5.6 8.2  NEUTROABS 3.2  --   HGB 12.1* 12.1*  HCT 38.4* 37.7*  MCV 80.7 78.5  PLT 143* 159   Cardiac Enzymes:  Recent Labs Lab 02/16/15 0824 02/16/15 1436  TROPONINI 0.03 0.04*   BNP: BNP (last 3 results)  Recent Labs  02/15/15 2028  BNP 163.5*    ProBNP (last 3 results) No results for input(s): PROBNP in the last  8760 hours.  CBG:  Recent Labs Lab 02/16/15 1929 02/16/15 2346 02/17/15 0413 02/17/15 0730 02/17/15 1149  GLUCAP 280* 205* 166* 131* 151*       Signed:  Ennifer Harston  [facility] 02/17/2015, 12:59 PM

## 2015-03-12 ENCOUNTER — Encounter: Payer: Self-pay | Admitting: Family Medicine

## 2015-03-12 ENCOUNTER — Ambulatory Visit (INDEPENDENT_AMBULATORY_CARE_PROVIDER_SITE_OTHER): Payer: Self-pay | Admitting: Family Medicine

## 2015-03-12 VITALS — BP 148/92 | HR 95 | Temp 98.2°F | Resp 16 | Ht 74.5 in | Wt 268.0 lb

## 2015-03-12 DIAGNOSIS — E1169 Type 2 diabetes mellitus with other specified complication: Secondary | ICD-10-CM

## 2015-03-12 DIAGNOSIS — Z23 Encounter for immunization: Secondary | ICD-10-CM

## 2015-03-12 DIAGNOSIS — D649 Anemia, unspecified: Secondary | ICD-10-CM

## 2015-03-12 DIAGNOSIS — Z8739 Personal history of other diseases of the musculoskeletal system and connective tissue: Secondary | ICD-10-CM

## 2015-03-12 DIAGNOSIS — I10 Essential (primary) hypertension: Secondary | ICD-10-CM

## 2015-03-12 DIAGNOSIS — R82998 Other abnormal findings in urine: Secondary | ICD-10-CM

## 2015-03-12 DIAGNOSIS — Z8639 Personal history of other endocrine, nutritional and metabolic disease: Secondary | ICD-10-CM

## 2015-03-12 DIAGNOSIS — N390 Urinary tract infection, site not specified: Secondary | ICD-10-CM

## 2015-03-12 LAB — COMPLETE METABOLIC PANEL WITH GFR
ALBUMIN: 4.3 g/dL (ref 3.6–5.1)
ALT: 15 U/L (ref 9–46)
AST: 15 U/L (ref 10–35)
Alkaline Phosphatase: 174 U/L — ABNORMAL HIGH (ref 40–115)
BILIRUBIN TOTAL: 0.3 mg/dL (ref 0.2–1.2)
BUN: 15 mg/dL (ref 7–25)
CALCIUM: 9.3 mg/dL (ref 8.6–10.3)
CO2: 25 mmol/L (ref 20–31)
CREATININE: 0.87 mg/dL (ref 0.70–1.33)
Chloride: 106 mmol/L (ref 98–110)
GFR, Est Non African American: >89 mL/min (ref >=60)
Glucose, Bld: 128 mg/dL — ABNORMAL HIGH (ref 65–99)
Potassium: 4 mmol/L (ref 3.5–5.3)
Sodium: 140 mmol/L (ref 135–146)
TOTAL PROTEIN: 7 g/dL (ref 6.1–8.1)

## 2015-03-12 LAB — POCT URINALYSIS DIP (DEVICE)
BILIRUBIN URINE: NEGATIVE
Glucose, UA: NEGATIVE mg/dL
Ketones, ur: NEGATIVE mg/dL
NITRITE: NEGATIVE
PH: 5.5 (ref 5.0–8.0)
Protein, ur: 30 mg/dL — AB
Specific Gravity, Urine: >=1.03 (ref 1.005–1.030)
UROBILINOGEN UA: 0.2 mg/dL (ref 0.0–1.0)

## 2015-03-12 LAB — LIPID PANEL
CHOLESTEROL: 196 mg/dL (ref 125–200)
HDL: 34 mg/dL — ABNORMAL LOW (ref >=40)
LDL Cholesterol: 118 mg/dL (ref <130)
TRIGLYCERIDES: 221 mg/dL — AB (ref <150)
Total CHOL/HDL Ratio: 5.8 Ratio — ABNORMAL HIGH (ref <=5.0)
VLDL: 44 mg/dL — ABNORMAL HIGH (ref <30)

## 2015-03-12 NOTE — Progress Notes (Signed)
Subjective:    Patient ID: Charles Travis, male    DOB: 1960-09-11, 55 y.o.   MRN: [id number]  HPI Mr. Liborio Saccente, a 55 year old male with a history of hypertension and type 2 diabetes presents to establish care. He maintains that he was a patient of Dr. Fara Chute  in [place], [place]. He states that he is transitioning all care locally. He works M-F and is unable to get back to [place] for appointments.  Patient has a history of hypertension and diabetes. He is not exercising and is not adherent to low salt diet. He does not check blood pressures at home. Patient denies chest pain, dyspnea, fatigue, irregular heart beat, palpitations, syncope and tachypnea. Patient also has a history of type 2 diabetes melitis. He states that he consistently takes antidiabetic medications daily.  Patient denies hyperglycemia, paresthesia of the feet, polydipsia, polyuria, visual disturbances, vomitting and weight loss.   Past Medical History  Diagnosis Date  . Diabetes mellitus without complication (HCC)   . HTN (hypertension)   No Known Allergies  Immunization History  Administered Date(s) Administered  . Influenza,inj,Quad PF,36+ Mos 02/17/2015  . Pneumococcal Polysaccharide-23 02/17/2015  . Tdap 03/12/2015   Social History   Social History  . Marital Status: Widowed    Spouse Name: N/A  . Number of Children: N/A  . Years of Education: N/A   Occupational History  . Not on file.   Social History Main Topics  . Smoking status: Former Smoker    Quit date: 01/22/2006  . Smokeless tobacco: Never Used  . Alcohol Use: Yes     Comment: occasionally  . Drug Use: No  . Sexual Activity: Not Currently   Other Topics Concern  . Not on file   Social History Narrative   Review of Systems  Constitutional: Negative.  Negative for fatigue.  HENT: Negative.   Eyes: Negative.  Negative for visual disturbance.  Respiratory: Negative.  Negative for shortness of breath.   Cardiovascular: Negative.   Negative for palpitations and leg swelling.  Gastrointestinal: Negative.   Endocrine: Negative.  Negative for polydipsia, polyphagia and polyuria.  Genitourinary: Negative.   Musculoskeletal: Positive for myalgias.  Skin: Negative.   Allergic/Immunologic: Negative.   Neurological: Negative.   Hematological: Negative.   Psychiatric/Behavioral: Negative.        Objective:   Physical Exam  Constitutional: He is oriented to person, place, and time. He appears well-developed and well-nourished.  HENT:  Head: Normocephalic and atraumatic.  Right Ear: External ear normal.  Left Ear: External ear normal.  Nose: Nose normal.  Mouth/Throat: Oropharynx is clear and moist.  Eyes: Conjunctivae and EOM are normal. Pupils are equal, round, and reactive to light.  Neck: Normal range of motion. Neck supple.  Cardiovascular: Normal rate, regular rhythm, normal heart sounds and intact distal pulses.   Pulmonary/Chest: Effort normal and breath sounds normal.  Abdominal: Soft. Bowel sounds are normal.  Musculoskeletal: Normal range of motion.  Neurological: He is alert and oriented to person, place, and time. He has normal reflexes.  Skin: Skin is warm and dry.  Psychiatric: He has a normal mood and affect. His behavior is normal. Judgment and thought content normal.     BP 148/92 mmHg  Pulse 95  Temp(Src) 98.2 F (36.8 C) (Oral)  Resp 16  Ht 6' 2.5" (1.892 m)  Wt 268 lb (121.564 kg)  BMI 33.96 kg/m2 Assessment & Plan:   1. Essential hypertension Blood pressure is at goal on  current medication regimen. Will continue medications at current dosage. The patient is asked to make an attempt to improve diet and exercise patterns to aid in medical management of this problem. - POCT urinalysis dipstick - Lipid Panel  2. Type 2 diabetes mellitus with other specified complication, without long-term current use of insulin (HCC) Will check hemoglobin a1c. He states that he has not had an A1C check  since last may. He checked fasting blood sugar this am, which was 200. He maintains that he is taking medication consistently. Will follow up by phone after reviewing laboratory results. Recommend a lowfat, low carbohydrate diet divided over 5-6 small meals, increase water intake to 6-8 glasses, and 150 minutes per week of cardiovascular exercise. Discussed diet and exercise at length.   - POCT urinalysis dipstick - COMPLETE METABOLIC PANEL WITH GFR - Lipid Panel - Hemoglobin A1c -Referral to opthalmology 3. ANEMIA, MILD - CBC with Differential  4. Need for Tdap vaccination  - Tdap vaccine greater than or equal to 7yo IM   Routine Health Maintenance:  Will review notes from Dr. Neita CarpSasser as they become available.  Colonoscopy 6 years ago with Dr. Jonette EvaSandi Fields Prostate exam 2016 Will send referral to opthalmology    The patient was given clear instructions to go to ER or return to medical center if symptoms do not improve, worsen or new problems develop. The patient verbalized understanding. Will notify patient with laboratory results.  Massie MaroonHollis,Caden Fukushima M, FNP

## 2015-03-12 NOTE — Patient Instructions (Signed)
DASH Eating Plan DASH stands for "Dietary Approaches to Stop Hypertension." The DASH eating plan is a healthy eating plan that has been shown to reduce high blood pressure (hypertension). Additional health benefits may include reducing the risk of type 2 diabetes mellitus, heart disease, and stroke. The DASH eating plan may also help with weight loss. WHAT DO I NEED TO KNOW ABOUT THE DASH EATING PLAN? For the DASH eating plan, you will follow these general guidelines:  Choose foods with a percent daily value for sodium of less than 5% (as listed on the food label).  Use salt-free seasonings or herbs instead of table salt or sea salt.  Check with your health care provider or pharmacist before using salt substitutes.  Eat lower-sodium products, often labeled as "lower sodium" or "no salt added."  Eat fresh foods.  Eat more vegetables, fruits, and low-fat dairy products.  Choose whole grains. Look for the word "whole" as the first word in the ingredient list.  Choose fish and skinless chicken or turkey more often than red meat. Limit fish, poultry, and meat to 6 oz (170 g) each day.  Limit sweets, desserts, sugars, and sugary drinks.  Choose heart-healthy fats.  Limit cheese to 1 oz (28 g) per day.  Eat more home-cooked food and less restaurant, buffet, and fast food.  Limit fried foods.  Cook foods using methods other than frying.  Limit canned vegetables. If you do use them, rinse them well to decrease the sodium.  When eating at a restaurant, ask that your food be prepared with less salt, or no salt if possible. WHAT FOODS CAN I EAT? Seek help from a dietitian for individual calorie needs. Grains Whole grain or whole wheat bread. Brown rice. Whole grain or whole wheat pasta. Quinoa, bulgur, and whole grain cereals. Low-sodium cereals. Corn or whole wheat flour tortillas. Whole grain cornbread. Whole grain crackers. Low-sodium crackers. Vegetables Fresh or frozen vegetables  (raw, steamed, roasted, or grilled). Low-sodium or reduced-sodium tomato and vegetable juices. Low-sodium or reduced-sodium tomato sauce and paste. Low-sodium or reduced-sodium canned vegetables.  Fruits All fresh, canned (in natural juice), or frozen fruits. Meat and Other Protein Products Ground beef (85% or leaner), grass-fed beef, or beef trimmed of fat. Skinless chicken or turkey. Ground chicken or turkey. Pork trimmed of fat. All fish and seafood. Eggs. Dried beans, peas, or lentils. Unsalted nuts and seeds. Unsalted canned beans. Dairy Low-fat dairy products, such as skim or 1% milk, 2% or reduced-fat cheeses, low-fat ricotta or cottage cheese, or plain low-fat yogurt. Low-sodium or reduced-sodium cheeses. Fats and Oils Tub margarines without trans fats. Light or reduced-fat mayonnaise and salad dressings (reduced sodium). Avocado. Safflower, olive, or canola oils. Natural peanut or almond butter. Other Unsalted popcorn and pretzels. The items listed above may not be a complete list of recommended foods or beverages. Contact your dietitian for more options. WHAT FOODS ARE NOT RECOMMENDED? Grains White bread. White pasta. White rice. Refined cornbread. Bagels and croissants. Crackers that contain trans fat. Vegetables Creamed or fried vegetables. Vegetables in a cheese sauce. Regular canned vegetables. Regular canned tomato sauce and paste. Regular tomato and vegetable juices. Fruits Dried fruits. Canned fruit in light or heavy syrup. Fruit juice. Meat and Other Protein Products Fatty cuts of meat. Ribs, chicken wings, bacon, sausage, bologna, salami, chitterlings, fatback, hot dogs, bratwurst, and packaged luncheon meats. Salted nuts and seeds. Canned beans with salt. Dairy Whole or 2% milk, cream, half-and-half, and cream cheese. Whole-fat or sweetened yogurt. Full-fat   cheeses or blue cheese. Nondairy creamers and whipped toppings. Processed cheese, cheese spreads, or cheese  curds. Condiments Onion and garlic salt, seasoned salt, table salt, and sea salt. Canned and packaged gravies. Worcestershire sauce. Tartar sauce. Barbecue sauce. Teriyaki sauce. Soy sauce, including reduced sodium. Steak sauce. Fish sauce. Oyster sauce. Cocktail sauce. Horseradish. Ketchup and mustard. Meat flavorings and tenderizers. Bouillon cubes. Hot sauce. Tabasco sauce. Marinades. Taco seasonings. Relishes. Fats and Oils Butter, stick margarine, lard, shortening, ghee, and bacon fat. Coconut, palm kernel, or palm oils. Regular salad dressings. Other Pickles and olives. Salted popcorn and pretzels. The items listed above may not be a complete list of foods and beverages to avoid. Contact your dietitian for more information. WHERE CAN I FIND MORE INFORMATION? National Heart, Lung, and Blood Institute: travelstabloid.com   This information is not intended to replace advice given to you by your health care provider. Make sure you discuss any questions you have with your health care provider.   Document Released: 02/04/2011 Document Revised: 03/08/2014 Document Reviewed: 12/20/2012 Elsevier Interactive Patient Education 2016 [employer] Carbohydrate Counting for Diabetes Mellitus Carbohydrate counting is a method for keeping track of the amount of carbohydrates you eat. Eating carbohydrates naturally increases the level of sugar (glucose) in your blood, so it is important for you to know the amount that is okay for you to have in every meal. Carbohydrate counting helps keep the level of glucose in your blood within normal limits. The amount of carbohydrates allowed is different for every person. A dietitian can help you calculate the amount that is right for you. Once you know the amount of carbohydrates you can have, you can count the carbohydrates in the foods you want to eat. Carbohydrates are found in the following foods:  Grains, such as breads and  cereals.  Dried beans and soy products.  Starchy vegetables, such as potatoes, peas, and corn.  Fruit and fruit juices.  Milk and yogurt.  Sweets and snack foods, such as cake, cookies, candy, chips, soft drinks, and fruit drinks. CARBOHYDRATE COUNTING There are two ways to count the carbohydrates in your food. You can use either of the methods or a combination of both. Reading the "Nutrition Facts" on [place] The "Nutrition Facts" is an area that is included on the labels of almost all packaged food and beverages in the [place]. It includes the serving size of that food or beverage and information about the nutrients in each serving of the food, including the grams (g) of carbohydrate per serving.  Decide the number of servings of this food or beverage that you will be able to eat or drink. Multiply that number of servings by the number of grams of carbohydrate that is listed on the label for that serving. The total will be the amount of carbohydrates you will be having when you eat or drink this food or beverage. Learning Standard Serving Sizes of Food When you eat food that is not packaged or does not include "Nutrition Facts" on the label, you need to measure the servings in order to count the amount of carbohydrates.A serving of most carbohydrate-rich foods contains about 15 g of carbohydrates. The following list includes serving sizes of carbohydrate-rich foods that provide 15 g ofcarbohydrate per serving:   1 slice of bread (1 oz) or 1 six-inch tortilla.    of a hamburger bun or English muffin.  4-6 crackers.   cup unsweetened dry cereal.    cup hot cereal.  cup rice or pasta.    cup mashed potatoes or  of a large baked potato.  1 cup fresh fruit or one small piece of fruit.    cup canned or frozen fruit or fruit juice.  1 cup milk.   cup plain fat-free yogurt or yogurt sweetened with artificial sweeteners.   cup cooked dried beans or starchy  vegetable, such as peas, corn, or potatoes.  Decide the number of standard-size servings that you will eat. Multiply that number of servings by 15 (the grams of carbohydrates in that serving). For example, if you eat 2 cups of strawberries, you will have eaten 2 servings and 30 g of carbohydrates (2 servings x 15 g = 30 g). For foods such as soups and casseroles, in which more than one food is mixed in, you will need to count the carbohydrates in each food that is included. EXAMPLE OF CARBOHYDRATE COUNTING Sample Dinner  3 oz chicken breast.   cup of brown rice.   cup of corn.  1 cup milk.   1 cup strawberries with sugar-free whipped topping.  Carbohydrate Calculation Step 1: Identify the foods that contain carbohydrates:   Rice.   Corn.   Milk.   Strawberries. Step 2:Calculate the number of servings eaten of each:   2 servings of rice.   1 serving of corn.   1 serving of milk.   1 serving of strawberries. Step 3: Multiply each of those number of servings by 15 g:   2 servings of rice x 15 g = 30 g.   1 serving of corn x 15 g = 15 g.   1 serving of milk x 15 g = 15 g.   1 serving of strawberries x 15 g = 15 g. Step 4: Add together all of the amounts to find the total grams of carbohydrates eaten: 30 g + 15 g + 15 g + 15 g = 75 g.   This information is not intended to replace advice given to you by your health care provider. Make sure you discuss any questions you have with your health care provider.   Document Released: 02/15/2005 Document Revised: 03/08/2014 Document Reviewed: 01/12/2013 Elsevier Interactive Patient Education 2016 [employer]. Diabetes and Exercise Exercising regularly is important. It is not just about losing weight. It has many health benefits, such as:  Improving your overall fitness, flexibility, and endurance.  Increasing your bone density.  Helping with weight control.  Decreasing your body fat.  Increasing your  muscle strength.  Reducing stress and tension.  Improving your overall health. People with diabetes who exercise gain additional benefits because exercise:  Reduces appetite.  Improves the body's use of blood sugar (glucose).  Helps lower or control blood glucose.  Decreases blood pressure.  Helps control blood lipids (such as cholesterol and triglycerides).  Improves the body's use of the hormone insulin by:  Increasing the body's insulin sensitivity.  Reducing the body's insulin needs.  Decreases the risk for heart disease because exercising:  Lowers cholesterol and triglycerides levels.  Increases the levels of good cholesterol (such as high-density lipoproteins [HDL]) in the body.  Lowers blood glucose levels. YOUR ACTIVITY PLAN  Choose an activity that you enjoy, and set realistic goals. To exercise safely, you should begin practicing any new physical activity slowly, and gradually increase the intensity of the exercise over time. Your health care provider or diabetes educator can help create an activity plan that works for you. General recommendations include:  Encouraging children to engage in at least 60 minutes of physical activity each day.  Stretching and performing strength training exercises, such as yoga or weight lifting, at least 2 times per week.  Performing a total of at least 150 minutes of moderate-intensity exercise each week, such as brisk walking or water aerobics.  Exercising at least 3 days per week, making sure you allow no more than 2 consecutive days to pass without exercising.  Avoiding long periods of inactivity (90 minutes or more). When you have to spend an extended period of time sitting down, take frequent breaks to walk or stretch. RECOMMENDATIONS FOR EXERCISING WITH TYPE 1 OR TYPE 2 DIABETES   Check your blood glucose before exercising. If blood glucose levels are greater than 240 mg/dL, check for urine ketones. Do not exercise if  ketones are present.  Avoid injecting insulin into areas of the body that are going to be exercised. For example, avoid injecting insulin into:  The arms when playing tennis.  The legs when jogging.  Keep a record of:  Food intake before and after you exercise.  Expected peak times of insulin action.  Blood glucose levels before and after you exercise.  The type and amount of exercise you have done.  Review your records with your health care provider. Your health care provider will help you to develop guidelines for adjusting food intake and insulin amounts before and after exercising.  If you take insulin or oral hypoglycemic agents, watch for signs and symptoms of hypoglycemia. They include:  Dizziness.  Shaking.  Sweating.  Chills.  Confusion.  Drink plenty of water while you exercise to prevent dehydration or heat stroke. Body water is lost during exercise and must be replaced.  Talk to your health care provider before starting an exercise program to make sure it is safe for you. Remember, almost any type of activity is better than none.   This information is not intended to replace advice given to you by your health care provider. Make sure you discuss any questions you have with your health care provider.   Document Released: 05/08/2003 Document Revised: 07/02/2014 Document Reviewed: 07/25/2012 Elsevier Interactive Patient Education [employer]vier Inc.

## 2015-03-13 ENCOUNTER — Telehealth: Payer: Self-pay | Admitting: Family Medicine

## 2015-03-13 DIAGNOSIS — E1169 Type 2 diabetes mellitus with other specified complication: Secondary | ICD-10-CM

## 2015-03-13 DIAGNOSIS — E781 Pure hyperglyceridemia: Secondary | ICD-10-CM

## 2015-03-13 DIAGNOSIS — I10 Essential (primary) hypertension: Secondary | ICD-10-CM

## 2015-03-13 LAB — CBC WITH DIFFERENTIAL/PLATELET
BASOS ABS: 0 10*3/uL (ref 0.0–0.1)
BASOS PCT: 1 % (ref 0–1)
EOS ABS: 0.2 10*3/uL (ref 0.0–0.7)
Eosinophils Relative: 5 % (ref 0–5)
HEMATOCRIT: 37.6 % — AB (ref 39.0–52.0)
HEMOGLOBIN: 12.3 g/dL — AB (ref 13.0–17.0)
LYMPHS ABS: 1.3 10*3/uL (ref 0.7–4.0)
Lymphocytes Relative: 29 % (ref 12–46)
MCH: 24.7 pg — ABNORMAL LOW (ref 26.0–34.0)
MCHC: 32.7 g/dL (ref 30.0–36.0)
MCV: 75.7 fL — ABNORMAL LOW (ref 78.0–100.0)
MONOS PCT: 6 % (ref 3–12)
Monocytes Absolute: 0.3 10*3/uL (ref 0.1–1.0)
NEUTROS ABS: 2.6 10*3/uL (ref 1.7–7.7)
NEUTROS PCT: 59 % (ref 43–77)
PLATELETS: 149 10*3/uL — AB (ref 150–400)
RBC: 4.97 MIL/uL (ref 4.22–5.81)
RDW: 15.5 % (ref 11.5–15.5)
WBC: 4.4 10*3/uL (ref 4.0–10.5)

## 2015-03-13 LAB — HEMOGLOBIN A1C
HEMOGLOBIN A1C: 8 % — AB (ref <5.7)
MEAN PLASMA GLUCOSE: 183 mg/dL — AB (ref <117)

## 2015-03-13 MED ORDER — AMLODIPINE BESYLATE 10 MG PO TABS: 10.0000 mg | ORAL_TABLET | Freq: Every day | ORAL | Status: DC

## 2015-03-13 MED ORDER — GEMFIBROZIL 600 MG PO TABS: 600.0000 mg | ORAL_TABLET | Freq: Two times a day (BID) | ORAL | Status: DC

## 2015-03-13 MED ORDER — METFORMIN HCL 1000 MG PO TABS: 1000.0000 mg | ORAL_TABLET | Freq: Two times a day (BID) | ORAL | Status: DC

## 2015-03-13 MED ORDER — GLIPIZIDE ER 10 MG PO TB24: 10.0000 mg | ORAL_TABLET | Freq: Every day | ORAL | Status: DC

## 2015-03-13 NOTE — Telephone Encounter (Signed)
Left message asking patient to return call regarding lab results. Thanks!

## 2015-03-13 NOTE — Telephone Encounter (Signed)
Reviewed labs, hemoglobin a1C is 8.0. Will continue antidiabetic medications at current dosage. Also, triglycerides are elevated at 220, goal is <150. Will follow up in 3 months as scheduled. Recommend a lowfat, low carbohydrate diet divided over 5-6 small meals, increase water intake to 6-8 glasses, and 150 minutes per week of cardiovascular exercise.    Meds ordered this encounter  Medications  . gemfibrozil (LOPID) 600 MG tablet    Sig: Take 1 tablet (600 mg total) by mouth 2 (two) times daily. Reported on 03/12/2015    Dispense:  30 tablet    Refill:  5  . metFORMIN (GLUCOPHAGE) 1000 MG tablet    Sig: Take 1 tablet (1,000 mg total) by mouth 2 (two) times daily with a meal. Reported on 03/12/2015    Dispense:  60 tablet    Refill:  5  . glipiZIDE (GLUCOTROL XL) 10 MG 24 hr tablet    Sig: Take 1 tablet (10 mg total) by mouth daily.    Dispense:  30 tablet    Refill:  5  . amLODipine (NORVASC) 10 MG tablet    Sig: Take 1 tablet (10 mg total) by mouth daily.    Dispense:  30 tablet    Refill:  5    Edgar Reisz M, FNP

## 2015-03-14 LAB — URINE CULTURE
Colony Count: NO GROWTH
ORGANISM ID, BACTERIA: NO GROWTH

## 2015-03-14 NOTE — Telephone Encounter (Signed)
Patient returned call and was advised of labs and new rx's that were sent into pharmacy and to eat low fat/ low carb diet. Thanks!

## 2015-04-18 ENCOUNTER — Other Ambulatory Visit: Payer: Self-pay

## 2015-04-18 ENCOUNTER — Other Ambulatory Visit: Payer: Self-pay | Admitting: Family Medicine

## 2015-04-18 MED ORDER — LISINOPRIL 10 MG PO TABS: 10.0000 mg | ORAL_TABLET | Freq: Every day | ORAL | Status: DC

## 2015-05-07 ENCOUNTER — Ambulatory Visit: Payer: Self-pay

## 2015-05-27 ENCOUNTER — Telehealth: Payer: Self-pay

## 2015-05-27 DIAGNOSIS — Z8739 Personal history of other diseases of the musculoskeletal system and connective tissue: Secondary | ICD-10-CM

## 2015-05-27 MED ORDER — ALLOPURINOL 300 MG PO TABS: 300.0000 mg | ORAL_TABLET | Freq: Every day | ORAL | Status: DC

## 2015-05-27 NOTE — Telephone Encounter (Signed)
Pt called requesting a refill on his medication, allopurninol (300mg ). [facility] Thanks!

## 2015-05-27 NOTE — Telephone Encounter (Signed)
Refill for allopurninol sent into pharmacy. Thanks!

## 2015-06-12 ENCOUNTER — Ambulatory Visit: Payer: Self-pay | Admitting: Family Medicine

## 2015-06-27 ENCOUNTER — Ambulatory Visit (INDEPENDENT_AMBULATORY_CARE_PROVIDER_SITE_OTHER): Payer: BLUE CROSS/BLUE SHIELD | Admitting: Family Medicine

## 2015-06-27 ENCOUNTER — Encounter: Payer: Self-pay | Admitting: Family Medicine

## 2015-06-27 VITALS — BP 172/98 | HR 88 | Temp 98.3°F | Resp 16 | Ht 74.5 in | Wt 272.0 lb

## 2015-06-27 DIAGNOSIS — E1169 Type 2 diabetes mellitus with other specified complication: Secondary | ICD-10-CM | POA: Diagnosis not present

## 2015-06-27 DIAGNOSIS — I10 Essential (primary) hypertension: Secondary | ICD-10-CM

## 2015-06-27 DIAGNOSIS — R8290 Unspecified abnormal findings in urine: Secondary | ICD-10-CM | POA: Diagnosis not present

## 2015-06-27 DIAGNOSIS — N529 Male erectile dysfunction, unspecified: Secondary | ICD-10-CM | POA: Diagnosis not present

## 2015-06-27 LAB — CBC WITH DIFFERENTIAL/PLATELET
BASOS ABS: 0 {cells}/uL (ref 0–200)
Basophils Relative: 0 %
EOS ABS: 147 {cells}/uL (ref 15–500)
EOS PCT: 3 %
HCT: 39.1 % (ref 38.5–50.0)
Hemoglobin: 12.6 g/dL — ABNORMAL LOW (ref 13.2–17.1)
LYMPHS PCT: 36 %
Lymphs Abs: 1764 cells/uL (ref 850–3900)
MCH: 24.7 pg — AB (ref 27.0–33.0)
MCHC: 32.2 g/dL (ref 32.0–36.0)
MCV: 76.7 fL — AB (ref 80.0–100.0)
MONOS PCT: 8 %
Monocytes Absolute: 392 cells/uL (ref 200–950)
NEUTROS ABS: 2597 {cells}/uL (ref 1500–7800)
Neutrophils Relative %: 53 %
PLATELETS: 152 10*3/uL (ref 140–400)
RBC: 5.1 MIL/uL (ref 4.20–5.80)
RDW: 15.5 % — AB (ref 11.0–15.0)
WBC: 4.9 10*3/uL (ref 3.8–10.8)

## 2015-06-27 LAB — POCT URINALYSIS DIP (DEVICE)
BILIRUBIN URINE: NEGATIVE
Glucose, UA: NEGATIVE mg/dL
KETONES UR: NEGATIVE mg/dL
NITRITE: NEGATIVE
PH: 5.5 (ref 5.0–8.0)
Protein, ur: 100 mg/dL — AB
Specific Gravity, Urine: 1.02 (ref 1.005–1.030)
Urobilinogen, UA: 0.2 mg/dL (ref 0.0–1.0)

## 2015-06-27 LAB — COMPLETE METABOLIC PANEL WITH GFR
ALK PHOS: 188 U/L — AB (ref 40–115)
ALT: 12 U/L (ref 9–46)
AST: 15 U/L (ref 10–35)
Albumin: 4.5 g/dL (ref 3.6–5.1)
BILIRUBIN TOTAL: 0.4 mg/dL (ref 0.2–1.2)
BUN: 17 mg/dL (ref 7–25)
CO2: 27 mmol/L (ref 20–31)
Calcium: 9.7 mg/dL (ref 8.6–10.3)
Chloride: 101 mmol/L (ref 98–110)
Creat: 1.04 mg/dL (ref 0.70–1.33)
GFR, Est Non African American: 80 mL/min (ref >=60)
Glucose, Bld: 209 mg/dL — ABNORMAL HIGH (ref 65–99)
POTASSIUM: 4.2 mmol/L (ref 3.5–5.3)
Sodium: 138 mmol/L (ref 135–146)
Total Protein: 7.3 g/dL (ref 6.1–8.1)

## 2015-06-27 LAB — HEMOGLOBIN A1C
Hgb A1c MFr Bld: 9.2 % — ABNORMAL HIGH (ref <5.7)
Mean Plasma Glucose: 217 mg/dL

## 2015-06-27 MED ORDER — SILDENAFIL CITRATE 25 MG PO TABS: 25.0000 mg | ORAL_TABLET | ORAL | Status: AC | PRN

## 2015-06-27 MED ORDER — AMLODIPINE BESYLATE 10 MG PO TABS: 10.0000 mg | ORAL_TABLET | Freq: Every day | ORAL | Status: DC

## 2015-06-27 MED ORDER — CLONIDINE HCL 0.1 MG PO TABS
0.1000 mg | ORAL_TABLET | Freq: Once | ORAL | Status: AC
  Administered 2015-06-27: 0.1 mg via ORAL

## 2015-06-27 NOTE — Patient Instructions (Signed)
DASH Eating Plan DASH stands for "Dietary Approaches to Stop Hypertension." The DASH eating plan is a healthy eating plan that has been shown to reduce high blood pressure (hypertension). Additional health benefits may include reducing the risk of type 2 diabetes mellitus, heart disease, and stroke. The DASH eating plan may also help with weight loss. WHAT DO I NEED TO KNOW ABOUT THE DASH EATING PLAN? For the DASH eating plan, you will follow these general guidelines:  Choose foods with a percent daily value for sodium of less than 5% (as listed on the food label).  Use salt-free seasonings or herbs instead of table salt or sea salt.  Check with your health care provider or pharmacist before using salt substitutes.  Eat lower-sodium products, often labeled as "lower sodium" or "no salt added."  Eat fresh foods.  Eat more vegetables, fruits, and low-fat dairy products.  Choose whole grains. Look for the word "whole" as the first word in the ingredient list.  Choose fish and skinless chicken or turkey more often than red meat. Limit fish, poultry, and meat to 6 oz (170 g) each day.  Limit sweets, desserts, sugars, and sugary drinks.  Choose heart-healthy fats.  Limit cheese to 1 oz (28 g) per day.  Eat more home-cooked food and less restaurant, buffet, and fast food.  Limit fried foods.  Cook foods using methods other than frying.  Limit canned vegetables. If you do use them, rinse them well to decrease the sodium.  When eating at a restaurant, ask that your food be prepared with less salt, or no salt if possible. WHAT FOODS CAN I EAT? Seek help from a dietitian for individual calorie needs. Grains Whole grain or whole wheat bread. Brown rice. Whole grain or whole wheat pasta. Quinoa, bulgur, and whole grain cereals. Low-sodium cereals. Corn or whole wheat flour tortillas. Whole grain cornbread. Whole grain crackers. Low-sodium crackers. Vegetables Fresh or frozen vegetables  (raw, steamed, roasted, or grilled). Low-sodium or reduced-sodium tomato and vegetable juices. Low-sodium or reduced-sodium tomato sauce and paste. Low-sodium or reduced-sodium canned vegetables.  Fruits All fresh, canned (in natural juice), or frozen fruits. Meat and Other Protein Products Ground beef (85% or leaner), grass-fed beef, or beef trimmed of fat. Skinless chicken or turkey. Ground chicken or turkey. Pork trimmed of fat. All fish and seafood. Eggs. Dried beans, peas, or lentils. Unsalted nuts and seeds. Unsalted canned beans. Dairy Low-fat dairy products, such as skim or 1% milk, 2% or reduced-fat cheeses, low-fat ricotta or cottage cheese, or plain low-fat yogurt. Low-sodium or reduced-sodium cheeses. Fats and Oils Tub margarines without trans fats. Light or reduced-fat mayonnaise and salad dressings (reduced sodium). Avocado. Safflower, olive, or canola oils. Natural peanut or almond butter. Other Unsalted popcorn and pretzels. The items listed above may not be a complete list of recommended foods or beverages. Contact your dietitian for more options. WHAT FOODS ARE NOT RECOMMENDED? Grains White bread. White pasta. White rice. Refined cornbread. Bagels and croissants. Crackers that contain trans fat. Vegetables Creamed or fried vegetables. Vegetables in a cheese sauce. Regular canned vegetables. Regular canned tomato sauce and paste. Regular tomato and vegetable juices. Fruits Dried fruits. Canned fruit in light or heavy syrup. Fruit juice. Meat and Other Protein Products Fatty cuts of meat. Ribs, chicken wings, bacon, sausage, bologna, salami, chitterlings, fatback, hot dogs, bratwurst, and packaged luncheon meats. Salted nuts and seeds. Canned beans with salt. Dairy Whole or 2% milk, cream, half-and-half, and cream cheese. Whole-fat or sweetened yogurt. Full-fat   cheeses or blue cheese. Nondairy creamers and whipped toppings. Processed cheese, cheese spreads, or cheese  curds. Condiments Onion and garlic salt, seasoned salt, table salt, and sea salt. Canned and packaged gravies. Worcestershire sauce. Tartar sauce. Barbecue sauce. Teriyaki sauce. Soy sauce, including reduced sodium. Steak sauce. Fish sauce. Oyster sauce. Cocktail sauce. Horseradish. Ketchup and mustard. Meat flavorings and tenderizers. Bouillon cubes. Hot sauce. Tabasco sauce. Marinades. Taco seasonings. Relishes. Fats and Oils Butter, stick margarine, lard, shortening, ghee, and bacon fat. Coconut, palm kernel, or palm oils. Regular salad dressings. Other Pickles and olives. Salted popcorn and pretzels. The items listed above may not be a complete list of foods and beverages to avoid. Contact your dietitian for more information. WHERE CAN I FIND MORE INFORMATION? National Heart, [employer], and [employer] Institute: [employer]   This information is not intended to replace advice given to you by your health care provider. Make sure you discuss any questions you have with your health care provider.   Document Released: 02/04/2011 Document Revised: 03/08/2014 Document Reviewed: 12/20/2012 Elsevier Interactive Patient Education 2016 Elsevier Inc[employer] Erectile Dysfunction Erectile dysfunction is the inability to get or sustain a good enough erection to have sexual intercourse. Erectile dysfunction may involve:  Inability to get an erection.  Lack of enough hardness to allow penetration.  Loss of the erection before sex is finished.  Premature ejaculation. CAUSES  Certain drugs, such as:  Pain relievers.  Antihistamines.  Antidepressants.  Blood pressure medicines.  Water pills (diuretics).  Ulcer medicines.  Muscle relaxants.  Illegal drugs.  Excessive drinking.  Psychological causes, such as:  Anxiety.  Depression.  Sadness.  Exhaustion.  Performance fear.  Stress.  Physical causes, such as:  Artery problems. This may include  diabetes, smoking, liver disease, or atherosclerosis.  High blood pressure.  Hormonal problems, such as low testosterone.  Obesity.  Nerve problems. This may include back or pelvic injuries, diabetes mellitus, multiple sclerosis, or Parkinson disease. SYMPTOMS  Inability to get an erection.  Lack of enough hardness to allow penetration.  Loss of the erection before sex is finished.  Premature ejaculation.  Normal erections at some times, but with frequent unsatisfactory episodes.  Orgasms that are not satisfactory in sensation or frequency.  Low sexual satisfaction in either partner because of erection problems.  A curved penis occurring with erection. The curve may cause pain or may be too curved to allow for intercourse.  Never having nighttime erections. DIAGNOSIS Your caregiver can often diagnose this condition by:  Performing a physical exam to find other diseases or specific problems with the penis.  Asking you detailed questions about the problem.  Performing blood tests to check for diabetes mellitus or to measure hormone levels.  Performing urine tests to find other underlying health conditions.  Performing an ultrasound exam to check for scarring.  Performing a test to check blood flow to the penis.  Doing a sleep study at home to measure nighttime erections. TREATMENT   You may be prescribed medicines by mouth.  You may be given medicine injections into the penis.  You may be prescribed a vacuum pump with a ring.  Penile implant surgery may be performed. You may receive:  An inflatable implant.  A semirigid implant.  Blood vessel surgery may be performed. HOME CARE INSTRUCTIONS  If you are prescribed oral medicine, you should take the medicine as prescribed. Do not increase the dosage without first discussing it with your physician.  If you are using self-injections, be careful to avoid  any veins that are on the surface of the penis. Apply  pressure to the injection site for 5 minutes.  If you are using a vacuum pump, make sure you have read the instructions before using it. Discuss any questions with your physician before taking the pump home. SEEK MEDICAL CARE IF:  You experience pain that is not responsive to the pain medicine you have been prescribed.  You experience nausea or vomiting. SEEK IMMEDIATE MEDICAL CARE IF:   When taking oral or injectable medications, you experience an erection that lasts longer than 4 hours. If your physician is unavailable, go to the nearest emergency room for evaluation. An erection that lasts much longer than 4 hours can result in permanent damage to your penis.  You have pain that is severe.  You develop redness, severe pain, or severe swelling of your penis.  You have redness spreading up into your groin or lower abdomen.  You are unable to pass your urine.   This information is not intended to replace advice given to you by your health care provider. Make sure you discuss any questions you have with your health care provider.   Document Released: 02/13/2000 Document Revised: 10/18/2012 Document Reviewed: 07/20/2012 Elsevier Interactive Patient Education 2016 [employer]. Sildenafil tablets (Viagra) What is this medicine? SILDENAFIL (sil DEN a fil) is used to treat erection problems in men. This medicine may be used for other purposes; ask your health care provider or pharmacist if you have questions. What should I tell my health care provider before I take this medicine? They need to know if you have any of these conditions: -bleeding disorders -eye or vision problems, including a rare inherited eye disease called retinitis pigmentosa -anatomical deformation of the penis, Peyronie's disease, or history of priapism (painful and prolonged erection) -heart disease, angina, a history of heart attack, irregular heart beats, or other heart problems -high or low blood  pressure -history of blood diseases, like sickle cell anemia or leukemia -history of stomach bleeding -kidney disease -liver disease -stroke -an unusual or allergic reaction to sildenafil, other medicines, foods, dyes, or preservatives -pregnant or trying to get pregnant -breast-feeding How should I use this medicine? Take this medicine by mouth with a glass of water. Follow the directions on the prescription label. The dose is usually taken 1 hour before sexual activity. You should not take the dose more than once per day. Do not take your medicine more often than directed. Talk to your pediatrician regarding the use of this medicine in children. This medicine is not used in children for this condition. Overdosage: If you think you have taken too much of this medicine contact a poison control center or emergency room at once. NOTE: This medicine is only for you. Do not share this medicine with others. What if I miss a dose? This does not apply. Do not take double or extra doses. What may interact with this medicine? Do not take this medicine with any of the following medications: -cisapride -methscopolamine nitrate -nitrates like amyl nitrite, isosorbide dinitrate, isosorbide mononitrate, nitroglycerin -nitroprusside -other medicines for erectile dysfunction like avanafil, tadalafil, vardenafil -riociguat -other sildenafil products (Revatio) This medicine may also interact with the following medications: -certain drugs for high blood pressure -certain drugs for the treatment of HIV infection or AIDS -certain drugs used for fungal or yeast infections, like fluconazole, itraconazole, ketoconazole, and voriconazole -cimetidine -erythromycin -rifampin This list may not describe all possible interactions. Give your health care provider a list of all  the medicines, herbs, non-prescription drugs, or dietary supplements you use. Also tell them if you smoke, drink alcohol, or use illegal  drugs. Some items may interact with your medicine. What should I watch for while using this medicine? If you notice any changes in your vision while taking this drug, call your doctor or health care professional as soon as possible. Stop using this medicine and call your health care provider right away if you have a loss of sight in one or both eyes. Contact your doctor or health care professional right away if you have an erection that lasts longer than 4 hours or if it becomes painful. This may be a sign of a serious problem and must be treated right away to prevent permanent damage. If you experience symptoms of nausea, dizziness, chest pain or arm pain upon initiation of sexual activity after taking this medicine, you should refrain from further activity and call your doctor or health care professional as soon as possible. Do not drink alcohol to excess (examples, 5 glasses of wine or 5 shots of whiskey) when taking this medicine. When taken in excess, alcohol can increase your chances of getting a headache or getting dizzy, increasing your heart rate or lowering your blood pressure. Using this medicine does not protect you or your partner against HIV infection (the virus that causes AIDS) or other sexually transmitted diseases. What side effects may I notice from receiving this medicine? Side effects that you should report to your doctor or health care professional as soon as possible: -allergic reactions like skin rash, itching or hives, swelling of the face, lips, or tongue -breathing problems -changes in hearing -changes in vision -chest pain -fast, irregular heartbeat -prolonged or painful erection -seizures Side effects that usually do not require medical attention (report to your doctor or health care professional if they continue or are bothersome): -back pain -dizziness -flushing -headache -indigestion -muscle aches -nausea -stuffy or runny nose This list may not describe all  possible side effects. Call your doctor for medical advice about side effects. You may report side effects to FDA at 1-800-FDA-1088. Where should I keep my medicine? Keep out of reach of children. Store at room temperature between 15 and 30 degrees C (59 and 86 degrees F). Throw away any unused medicine after the expiration date. NOTE: This sheet is a summary. It may not cover all possible information. If you have questions about this medicine, talk to your doctor, pharmacist, or health care provider.    2016, Elsevier/Gold Standard. (2013-07-06 13:19:04)

## 2015-06-27 NOTE — Progress Notes (Signed)
Subjective:    Patient ID: Charles Travis, male    DOB: October 25, 1960, 55 y.o.   MRN: [id number]  HPI Mr. Dev Dhondt, a 55 year old male with a history of hypertension and type 2 diabetes presents for a 3 month follow-up of chronic conditions. Patient has a history of hypertension and diabetes. He is not exercising and is not adherent to low salt diet. He does not check blood pressures at home. He has only been taking Lisinopril 10 mg for hypertension. He has not been taking Amlodipine 10 mg. He states that he did not pick up medication from pharmacy. Blood pressure was markedly elevated on arrival.  Patient denies chest pain, dyspnea, fatigue, irregular heart beat, palpitations, syncope and tachypnea. Patient also has a history of type 2 diabetes melitis. He states that he consistently takes antidiabetic medications daily. Previous hemoglobin a1c was 8.0 %. He says that he has not been following a low carbohydrate diet.  Patient denies hyperglycemia, paresthesia of the feet, polydipsia, polyuria, visual disturbances, vomitting and weight loss.   Patient complains of erectile dysfunction for greater than 6 months. He has difficulty obtaining and maintaining an erection. He states that symptoms happen frequently. Full erections occur infrequently. Partial erections occur on most occasions. Libido is not affected. Risk factors for ED include history of diabetes and hypertension.  Past Medical History  Diagnosis Date  . Diabetes mellitus without complication (HCC)   . HTN (hypertension)   No Known Allergies  Immunization History  Administered Date(s) Administered  . Influenza,inj,Quad PF,36+ Mos 02/17/2015  . Pneumococcal Polysaccharide-23 02/17/2015  . Tdap 03/12/2015   Social History   Social History  . Marital Status: Widowed    Spouse Name: N/A  . Number of Children: N/A  . Years of Education: N/A   Occupational History  . Not on file.   Social History Main Topics  .  Smoking status: Former Smoker    Quit date: 01/22/2006  . Smokeless tobacco: Never Used  . Alcohol Use: Yes     Comment: occasionally  . Drug Use: No  . Sexual Activity: Not Currently   Other Topics Concern  . Not on file   Social History Narrative   Review of Systems  Constitutional: Negative.  Negative for fatigue.  HENT: Negative.   Eyes: Negative.  Negative for visual disturbance.  Respiratory: Negative.  Negative for shortness of breath.   Cardiovascular: Negative.  Negative for palpitations and leg swelling.  Gastrointestinal: Negative.   Endocrine: Negative.  Negative for polydipsia, polyphagia and polyuria.  Genitourinary: Negative.  Negative for dysuria, discharge, penile swelling and penile pain.       Erectile dysfunction  Skin: Negative.   Allergic/Immunologic: Negative.   Neurological: Negative.   Hematological: Negative.   Psychiatric/Behavioral: Negative.        Objective:   Physical Exam  Constitutional: He is oriented to person, place, and time. He appears well-developed and well-nourished.  HENT:  Head: Normocephalic and atraumatic.  Right Ear: External ear normal.  Left Ear: External ear normal.  Nose: Nose normal.  Mouth/Throat: Oropharynx is clear and moist.  Eyes: Conjunctivae and EOM are normal. Pupils are equal, round, and reactive to light.  Neck: Normal range of motion. Neck supple.  Cardiovascular: Normal rate, regular rhythm, normal heart sounds and intact distal pulses.   Pulmonary/Chest: Effort normal and breath sounds normal.  Abdominal: Soft. Bowel sounds are normal.  Genitourinary: Prostate normal. Guaiac negative stool. Prostate is not enlarged and not tender.  Musculoskeletal: Normal range of motion.  Neurological: He is alert and oriented to person, place, and time. He has normal reflexes.  Skin: Skin is warm and dry.  Psychiatric: He has a normal mood and affect. His behavior is normal. Judgment and thought content normal.     BP  186/104 mmHg  Pulse 88  Temp(Src) 98.3 F (36.8 C) (Oral)  Resp 16  Ht 6' 2.5" (1.892 m)  Wt 272 lb (123.378 kg)  BMI 34.47 kg/m2  SpO2 100% Assessment & Plan:   1. Essential hypertension Blood pressure is at goal on current medication regimen. Will continue medications at current dosage. The patient is asked to make an attempt to improve diet and exercise patterns to aid in medical management of this problem. - amLODipine (NORVASC) 10 MG tablet; Take 1 tablet (10 mg total) by mouth daily.  Dispense: 30 tablet; Refill: 5 - COMPLETE METABOLIC PANEL WITH GFR - POCT urinalysis dipstick - cloNIDine (CATAPRES) tablet 0.1 mg; Take 1 tablet (0.1 mg total) by mouth once.  2. Type 2 diabetes mellitus with other specified complication, without long-term current use of insulin (HCC) Will check hemoglobin a1c. He states that he has not had an A1C check since last may. He checked fasting blood sugar this am, which was 200. He maintains that he is taking medication consistently. Will follow up by phone after reviewing laboratory results. Recommend a lowfat, low carbohydrate diet divided over 5-6 small meals, increase water intake to 6-8 glasses, and 150 minutes per week of cardiovascular exercise. Discussed diet and exercise at length.  - COMPLETE METABOLIC PANEL WITH GFR - Hemoglobin A1c - POCT urinalysis dipstick - CBC with Differential  3. Erectile dysfunction, unspecified erectile dysfunction type Prostate within normal limits. Will start a trial of Sildenafil. Discussed potential side effects at length. Patient given written information and expressed understanding.  - PSA - sildenafil (VIAGRA) 25 MG tablet; Take 1 tablet (25 mg total) by mouth as needed for erectile dysfunction.  Dispense: 20 tablet; Refill: 0  4. Abnormal urinalysis - Urine culture  Routine Health Maintenance:   Colonoscopy 6 years ago with Dr. Jonette EvaSandi Fields Prostate exam 2016 Sent referral to opthalmology    The  patient was given clear instructions to go to ER or return to medical center if symptoms do not improve, worsen or new problems develop. The patient verbalized understanding. Will notify patient with laboratory results.  Massie MaroonHollis,Abdirizak Richison M, FNP

## 2015-06-28 LAB — PSA: PSA: 0.88 ng/mL (ref <=4.00)

## 2015-06-29 LAB — URINE CULTURE
Colony Count: NO GROWTH
Organism ID, Bacteria: NO GROWTH

## 2015-06-30 ENCOUNTER — Other Ambulatory Visit: Payer: Self-pay | Admitting: Family Medicine

## 2015-06-30 DIAGNOSIS — E1169 Type 2 diabetes mellitus with other specified complication: Secondary | ICD-10-CM

## 2015-06-30 MED ORDER — SITAGLIPTIN PHOS-METFORMIN HCL 50-1000 MG PO TABS: 1.0000 | ORAL_TABLET | Freq: Two times a day (BID) | ORAL | Status: AC

## 2015-06-30 MED ORDER — GLIPIZIDE ER 10 MG PO TB24: 10.0000 mg | ORAL_TABLET | Freq: Every day | ORAL | Status: DC

## 2015-06-30 NOTE — Progress Notes (Signed)
Called, no answer. Left message for patient to return call. Thanks!  

## 2015-06-30 NOTE — Progress Notes (Signed)
Patient returned call and I advised of labs and the need to change medication. Advised patient to stop metformin and start janumet and glipizide. Advised patient to focus on diet and to eat low fat/low carb over 5 to 6 small meals daily. Advised patient to get exercise throughout the week and keep 1 month follow up for June 1,2017. Patient verbalized understanding. Thanks!

## 2015-06-30 NOTE — Progress Notes (Signed)
Reviewed labs. Hemoglobin a1C has increased from 8.0 to 9.2%. Will add Janumet 50-1000 mg to medication regimen. Recommend a lowfat, low carbohydrate diet divided over 5-6 small meals, increase water intake to 6-8 glasses, and 150 minutes per week of cardiovascular exercise. Patient will follow up in office in 1 month.   Meds ordered this encounter  Medications  . glipiZIDE (GLUCOTROL XL) 10 MG 24 hr tablet    Sig: Take 1 tablet (10 mg total) by mouth daily.    Dispense:  30 tablet    Refill:  5  . sitaGLIPtin-metformin (JANUMET) 50-1000 MG tablet    Sig: Take 1 tablet by mouth 2 (two) times daily with a meal.    Dispense:  60 tablet    Refill:  5    Shawnice Tilmon M, FNP

## 2015-07-01 ENCOUNTER — Other Ambulatory Visit: Payer: Self-pay

## 2015-07-07 ENCOUNTER — Ambulatory Visit (INDEPENDENT_AMBULATORY_CARE_PROVIDER_SITE_OTHER): Payer: BLUE CROSS/BLUE SHIELD | Admitting: Family Medicine

## 2015-07-07 ENCOUNTER — Encounter: Payer: Self-pay | Admitting: Family Medicine

## 2015-07-07 VITALS — BP 130/88 | HR 68 | Temp 98.6°F | Resp 16 | Ht 73.5 in | Wt 271.0 lb

## 2015-07-07 DIAGNOSIS — M25475 Effusion, left foot: Secondary | ICD-10-CM

## 2015-07-07 DIAGNOSIS — M79672 Pain in left foot: Secondary | ICD-10-CM | POA: Diagnosis not present

## 2015-07-07 LAB — SEDIMENTATION RATE: SED RATE: 9 mm/h (ref 0–20)

## 2015-07-07 LAB — C-REACTIVE PROTEIN: CRP: 0.5 mg/dL (ref <0.60)

## 2015-07-07 LAB — URIC ACID: URIC ACID, SERUM: 3.6 mg/dL — AB (ref 4.0–7.8)

## 2015-07-07 MED ORDER — KETOROLAC TROMETHAMINE 60 MG/2ML IM SOLN
60.0000 mg | Freq: Once | INTRAMUSCULAR | Status: AC
  Administered 2015-07-07: 60 mg via INTRAMUSCULAR

## 2015-07-07 MED ORDER — NAPROXEN 500 MG PO TABS: 500.0000 mg | ORAL_TABLET | Freq: Two times a day (BID) | ORAL | Status: AC

## 2015-07-07 MED ORDER — TRAMADOL HCL 50 MG PO TABS: 50.0000 mg | ORAL_TABLET | Freq: Four times a day (QID) | ORAL | Status: AC | PRN

## 2015-07-07 NOTE — Progress Notes (Signed)
Subjective:    Patient ID: Charles Travis, male    DOB: 10-13-1960, 55 y.o.   MRN: [id number]  Foot Pain This is a recurrent problem. The current episode started yesterday. The problem occurs constantly. The problem has been gradually worsening. Associated symptoms include arthralgias (left foot pain) and joint swelling (left foot). Pertinent negatives include no abdominal pain, anorexia, change in bowel habit, chest pain, chills, congestion, coughing, diaphoresis, fatigue, fever, headaches, myalgias, nausea, neck pain, numbness, rash, sore throat, swollen glands, urinary symptoms, vertigo, visual change, vomiting or weakness. The symptoms are aggravated by walking, twisting and standing. He has tried immobilization for the symptoms. The treatment provided no relief.   Past Medical History  Diagnosis Date  . Diabetes mellitus without complication (HCC)   . HTN (hypertension)    Social History   Social History  . Marital Status: Widowed    Spouse Name: N/A  . Number of Children: N/A  . Years of Education: N/A   Occupational History  . Not on file.   Social History Main Topics  . Smoking status: Former Smoker    Quit date: 01/22/2006  . Smokeless tobacco: Never Used  . Alcohol Use: Yes     Comment: occasionally  . Drug Use: No  . Sexual Activity: Not Currently   Other Topics Concern  . Not on file   Social History Narrative  No Known Allergies  Review of Systems  Constitutional: Negative for fever, chills, diaphoresis and fatigue.  HENT: Negative for congestion and sore throat.   Respiratory: Negative for cough.   Cardiovascular: Negative for chest pain.  Gastrointestinal: Negative for nausea, vomiting, abdominal pain, anorexia and change in bowel habit.  Genitourinary: Negative.   Musculoskeletal: Positive for joint swelling (left foot) and arthralgias (left foot pain). Negative for myalgias and neck pain.  Skin: Negative for rash.  Neurological: Negative for  vertigo, weakness, numbness and headaches.      Objective:   Physical Exam  Constitutional: He is oriented to person, place, and time. He appears well-developed and well-nourished.  HENT:  Head: Normocephalic and atraumatic.  Right Ear: External ear normal.  Left Ear: External ear normal.  Nose: Nose normal.  Mouth/Throat: Oropharynx is clear and moist.  Eyes: Conjunctivae and EOM are normal. Pupils are equal, round, and reactive to light.  Neck: Normal range of motion. Neck supple.  Cardiovascular: Normal rate, regular rhythm, normal heart sounds and intact distal pulses.   Pulmonary/Chest: Effort normal and breath sounds normal.  Abdominal: Soft. Bowel sounds are normal.  Musculoskeletal:       Left ankle: He exhibits decreased range of motion and swelling (Warm and tender to palpation). He exhibits no ecchymosis, no deformity, no laceration and normal pulse.  Neurological: He is alert and oriented to person, place, and time. He has normal reflexes.  Skin: Skin is warm, dry and intact. No rash noted.  Psychiatric: He has a normal mood and affect. His behavior is normal. Judgment and thought content normal. Cognition and memory are normal.     BP 130/88 mmHg  Pulse 68  Temp(Src) 98.6 F (37 C) (Oral)  Resp 16  Ht 6' 1.5" (1.867 m)  Wt 271 lb (122.925 kg)  BMI 35.27 kg/m2  SpO2 100% Assessment & Plan:  1. Left foot pain Recommend that patient elevate lower extremity to heart level while at rest. Apply warm, moist compresses as needed. Continue Allopurinol as previously prescribed.  - ketorolac (TORADOL) injection 60 mg; Inject 2 mLs (60  mg total) into the muscle once. - Uric Acid - Sedimentation Rate - naproxen (NAPROSYN) 500 MG tablet; Take 1 tablet (500 mg total) by mouth 2 (two) times daily with a meal.  Dispense: 15 tablet; Refill: 0 - traMADol (ULTRAM) 50 MG tablet; Take 1 tablet (50 mg total) by mouth every 6 (six) hours as needed.  Dispense: 15 tablet; Refill: 0  2.  Swelling of foot joint, left Refer to #1 - C-reactive protein - naproxen (NAPROSYN) 500 MG tablet; Take 1 tablet (500 mg total) by mouth 2 (two) times daily with a meal.  Dispense: 15 tablet; Refill: 0   RTC: As needed  The patient was given clear instructions to go to ER or return to medical center if symptoms do not improve, worsen or new problems develop. The patient verbalized understanding. Will notify patient with laboratory results.   Massie MaroonHollis,Lachina M, FNP

## 2015-07-31 ENCOUNTER — Ambulatory Visit: Payer: Self-pay | Admitting: Family Medicine

## 2015-08-04 ENCOUNTER — Ambulatory Visit (INDEPENDENT_AMBULATORY_CARE_PROVIDER_SITE_OTHER): Payer: BLUE CROSS/BLUE SHIELD | Admitting: Family Medicine

## 2015-08-04 ENCOUNTER — Encounter: Payer: Self-pay | Admitting: Family Medicine

## 2015-08-04 VITALS — BP 150/90 | HR 92 | Temp 98.3°F | Resp 16 | Ht 74.0 in | Wt 268.0 lb

## 2015-08-04 DIAGNOSIS — R8290 Unspecified abnormal findings in urine: Secondary | ICD-10-CM | POA: Diagnosis not present

## 2015-08-04 DIAGNOSIS — I10 Essential (primary) hypertension: Secondary | ICD-10-CM | POA: Diagnosis not present

## 2015-08-04 DIAGNOSIS — E1169 Type 2 diabetes mellitus with other specified complication: Secondary | ICD-10-CM | POA: Diagnosis not present

## 2015-08-04 LAB — POCT URINALYSIS DIP (DEVICE)
Bilirubin Urine: NEGATIVE
GLUCOSE, UA: 100 mg/dL — AB
Hgb urine dipstick: NEGATIVE
KETONES UR: NEGATIVE mg/dL
Nitrite: NEGATIVE
Protein, ur: 30 mg/dL — AB
SPECIFIC GRAVITY, URINE: 1.02 (ref 1.005–1.030)
Urobilinogen, UA: 0.2 mg/dL (ref 0.0–1.0)
pH: 5 (ref 5.0–8.0)

## 2015-08-04 LAB — COMPLETE METABOLIC PANEL WITH GFR
ALT: 15 U/L (ref 9–46)
AST: 18 U/L (ref 10–35)
Albumin: 4.4 g/dL (ref 3.6–5.1)
Alkaline Phosphatase: 168 U/L — ABNORMAL HIGH (ref 40–115)
BUN: 16 mg/dL (ref 7–25)
CALCIUM: 9.3 mg/dL (ref 8.6–10.3)
CHLORIDE: 103 mmol/L (ref 98–110)
CO2: 25 mmol/L (ref 20–31)
Creat: 0.87 mg/dL (ref 0.70–1.33)
GFR, Est Non African American: >89 mL/min (ref >=60)
Glucose, Bld: 221 mg/dL — ABNORMAL HIGH (ref 65–99)
POTASSIUM: 3.9 mmol/L (ref 3.5–5.3)
Sodium: 136 mmol/L (ref 135–146)
Total Bilirubin: 0.3 mg/dL (ref 0.2–1.2)
Total Protein: 7.2 g/dL (ref 6.1–8.1)

## 2015-08-04 MED ORDER — LISINOPRIL 20 MG PO TABS: 20.0000 mg | ORAL_TABLET | Freq: Every day | ORAL | Status: DC

## 2015-08-04 NOTE — Progress Notes (Signed)
Subjective:    Patient ID: Charles Travis, male    DOB: [birth year], 55 y.o.   MRN: [id number]  Hypertension This is a chronic problem. The current episode started more than 1 year ago. The problem has been gradually improving since onset. The problem is uncontrolled. Pertinent negatives include no anxiety, blurred vision, chest pain, headaches, malaise/fatigue, neck pain, orthopnea, palpitations, peripheral edema, PND, shortness of breath or sweats. There are no associated agents to hypertension. Risk factors for coronary artery disease include diabetes mellitus, dyslipidemia, obesity and male gender. Past treatments include calcium channel blockers and ACE inhibitors. The current treatment provides mild improvement. Compliance problems include diet.  There is no history of angina, kidney disease, CAD/MI, CVA, heart failure, left ventricular hypertrophy, PVD, retinopathy or a thyroid problem. There is no history of sleep apnea.   Past Medical History  Diagnosis Date  . Diabetes mellitus without complication (HCC)   . HTN (hypertension)    Social History   Social History  . Marital Status: Widowed    Spouse Name: N/A  . Number of Children: N/A  . Years of Education: N/A   Occupational History  . Not on file.   Social History Main Topics  . Smoking status: Former Smoker    Quit date: 01/22/2006  . Smokeless tobacco: Never Used  . Alcohol Use: Yes     Comment: occasionally  . Drug Use: No  . Sexual Activity: Not Currently   Other Topics Concern  . Not on file   Social History Narrative  No Known Allergies  Review of Systems  Constitutional: Negative.  Negative for malaise/fatigue and fatigue.  HENT: Negative.   Eyes: Negative.  Negative for blurred vision.  Respiratory: Negative for shortness of breath.   Cardiovascular: Negative for chest pain, palpitations, orthopnea and PND.  Endocrine: Negative.   Genitourinary: Negative.   Musculoskeletal: Negative.  Negative  for neck pain.  Skin: Negative.   Allergic/Immunologic: Negative.   Neurological: Negative.  Negative for headaches.  Hematological: Negative.   Psychiatric/Behavioral: Negative.       Objective:   Physical Exam  Constitutional: He is oriented to person, place, and time. He appears well-developed and well-nourished.  HENT:  Head: Normocephalic and atraumatic.  Right Ear: External ear normal.  Left Ear: External ear normal.  Nose: Nose normal.  Mouth/Throat: Oropharynx is clear and moist.  Eyes: Conjunctivae and EOM are normal. Pupils are equal, round, and reactive to light.  Neck: Normal range of motion. Neck supple.  Cardiovascular: Normal rate, regular rhythm, normal heart sounds and intact distal pulses.   Pulmonary/Chest: Effort normal and breath sounds normal.  Abdominal: Soft. Bowel sounds are normal.  Neurological: He is alert and oriented to person, place, and time. He has normal reflexes.  Skin: Skin is warm, dry and intact.  Psychiatric: He has a normal mood and affect. His behavior is normal. Judgment and thought content normal. Cognition and memory are normal.     BP 150/90 mmHg  Pulse 92  Temp(Src) 98.3 F (36.8 C) (Oral)  Resp 16  Ht 6\' 2"  (1.88 m)  Wt 268 lb (121.564 kg)  BMI 34.39 kg/m2  SpO2 99% Assessment & Plan:  1. Essential hypertension Blood pressure is above goal on current medication regimen. Will increase Lisinopril to 20 mg daily. The patient is asked to make an attempt to improve diet and exercise patterns to aid in medical management of this problem. - Urinalysis Dipstick - COMPLETE METABOLIC PANEL WITH GFR - lisinopril (  PRINIVIL,ZESTRIL) 20 MG tablet; Take 1 tablet (20 mg total) by mouth daily.  Dispense: 30 tablet; Refill: 1 - POCT urinalysis dip (device)  2. Type 2 diabetes mellitus with other specified complication, without long-term current use of insulin (HCC) Previous hemoglobin a1C is 9.2. Mild glucosuria present. Recommend a lowfat, low  carbohydrate diet divided over 5-6 small meals, increase water intake to 6-8 glasses, and 150 minutes per week of cardiovascular exercise. Will re-check hemoglobin a1C in 2 months.   - POCT urinalysis dip (device)  3. Abnormal urinalysis Increased urine leukocytes.  - Urine culture  The patient was given clear instructions to go to ER or return to medical center if symptoms do not improve, worsen or new problems develop. The patient verbalized understanding. Will notify patient with laboratory results.   RTC: 1 month for a blood pressure check. Follow up in 3 months for hypertension and DMII  Keviana Guida M, FNP

## 2015-08-05 ENCOUNTER — Telehealth: Payer: Self-pay

## 2015-08-05 NOTE — Telephone Encounter (Signed)
Pt stated that the pharmacy said that the BP medication he was prescribed is not available in 20mg . He also stated that the pharmacy didn't receive the proper prescription for this BP medication. Thanks!

## 2015-08-05 NOTE — Telephone Encounter (Signed)
Called and left message after speaking with pharmacy. One medication has been picked up the other is being filled now. No other problems per pharmacists. Called and left message advising patient of this. Thanks!

## 2015-08-06 LAB — URINE CULTURE

## 2015-09-03 ENCOUNTER — Ambulatory Visit: Payer: Self-pay

## 2015-10-06 ENCOUNTER — Ambulatory Visit: Payer: BLUE CROSS/BLUE SHIELD

## 2015-10-06 ENCOUNTER — Other Ambulatory Visit: Payer: Self-pay | Admitting: Family Medicine

## 2015-10-06 VITALS — BP 161/85 | HR 83

## 2015-10-06 DIAGNOSIS — I10 Essential (primary) hypertension: Secondary | ICD-10-CM

## 2015-10-06 MED ORDER — LISINOPRIL-HYDROCHLOROTHIAZIDE 20-25 MG PO TABS
1.0000 | ORAL_TABLET | Freq: Every day | ORAL | 3 refills | Status: AC
Start: 2015-10-06 — End: ?

## 2015-10-24 ENCOUNTER — Other Ambulatory Visit: Payer: Self-pay | Admitting: Family Medicine

## 2015-10-24 DIAGNOSIS — E781 Pure hyperglyceridemia: Secondary | ICD-10-CM

## 2015-11-05 ENCOUNTER — Ambulatory Visit: Payer: Self-pay | Admitting: Family Medicine

## 2015-12-08 ENCOUNTER — Other Ambulatory Visit: Payer: Self-pay

## 2015-12-08 ENCOUNTER — Encounter: Payer: Self-pay | Admitting: Family Medicine

## 2015-12-08 ENCOUNTER — Ambulatory Visit (INDEPENDENT_AMBULATORY_CARE_PROVIDER_SITE_OTHER): Payer: BLUE CROSS/BLUE SHIELD | Admitting: Family Medicine

## 2015-12-08 VITALS — BP 151/81 | HR 90 | Temp 98.4°F | Resp 14 | Ht 73.0 in | Wt 266.0 lb

## 2015-12-08 DIAGNOSIS — Z1159 Encounter for screening for other viral diseases: Secondary | ICD-10-CM

## 2015-12-08 DIAGNOSIS — E119 Type 2 diabetes mellitus without complications: Secondary | ICD-10-CM | POA: Diagnosis not present

## 2015-12-08 DIAGNOSIS — Z23 Encounter for immunization: Secondary | ICD-10-CM

## 2015-12-08 DIAGNOSIS — E1169 Type 2 diabetes mellitus with other specified complication: Secondary | ICD-10-CM

## 2015-12-08 DIAGNOSIS — I10 Essential (primary) hypertension: Secondary | ICD-10-CM

## 2015-12-08 DIAGNOSIS — Z114 Encounter for screening for human immunodeficiency virus [HIV]: Secondary | ICD-10-CM | POA: Diagnosis not present

## 2015-12-08 LAB — LIPID PANEL
CHOLESTEROL: 150 mg/dL (ref 125–200)
HDL: 34 mg/dL — AB (ref >=40)
LDL CALC: 65 mg/dL (ref <130)
TRIGLYCERIDES: 254 mg/dL — AB (ref <150)
Total CHOL/HDL Ratio: 4.4 Ratio (ref <=5.0)
VLDL: 51 mg/dL — AB (ref <30)

## 2015-12-08 LAB — COMPLETE METABOLIC PANEL WITH GFR
ALT: 40 U/L (ref 9–46)
AST: 21 U/L (ref 10–35)
Albumin: 4.2 g/dL (ref 3.6–5.1)
Alkaline Phosphatase: 165 U/L — ABNORMAL HIGH (ref 40–115)
BUN: 15 mg/dL (ref 7–25)
CALCIUM: 9.2 mg/dL (ref 8.6–10.3)
CHLORIDE: 104 mmol/L (ref 98–110)
CO2: 25 mmol/L (ref 20–31)
Creat: 0.99 mg/dL (ref 0.70–1.33)
GFR, EST NON AFRICAN AMERICAN: 85 mL/min (ref >=60)
Glucose, Bld: 211 mg/dL — ABNORMAL HIGH (ref 65–99)
Potassium: 3.8 mmol/L (ref 3.5–5.3)
Sodium: 139 mmol/L (ref 135–146)
Total Bilirubin: 0.3 mg/dL (ref 0.2–1.2)
Total Protein: 7.2 g/dL (ref 6.1–8.1)

## 2015-12-08 LAB — CBC WITH DIFFERENTIAL/PLATELET
BASOS ABS: 44 {cells}/uL (ref 0–200)
Basophils Relative: 1 %
EOS ABS: 176 {cells}/uL (ref 15–500)
Eosinophils Relative: 4 %
HEMATOCRIT: 35.8 % — AB (ref 38.5–50.0)
Hemoglobin: 11.1 g/dL — ABNORMAL LOW (ref 13.2–17.1)
LYMPHS PCT: 27 %
Lymphs Abs: 1188 cells/uL (ref 850–3900)
MCH: 24.3 pg — AB (ref 27.0–33.0)
MCHC: 31 g/dL — AB (ref 32.0–36.0)
MCV: 78.3 fL — AB (ref 80.0–100.0)
MONO ABS: 308 {cells}/uL (ref 200–950)
MONOS PCT: 7 %
NEUTROS PCT: 61 %
Neutro Abs: 2684 cells/uL (ref 1500–7800)
PLATELETS: 151 10*3/uL (ref 140–400)
RBC: 4.57 MIL/uL (ref 4.20–5.80)
RDW: 14.5 % (ref 11.0–15.0)
WBC: 4.4 10*3/uL (ref 3.8–10.8)

## 2015-12-08 LAB — POCT GLYCOSYLATED HEMOGLOBIN (HGB A1C): Hemoglobin A1C: 9.7

## 2015-12-08 MED ORDER — METFORMIN HCL 1000 MG PO TABS: 1000.0000 mg | ORAL_TABLET | Freq: Two times a day (BID) | ORAL | 1 refills | Status: DC

## 2015-12-08 MED ORDER — AMLODIPINE BESYLATE 10 MG PO TABS: 10.0000 mg | ORAL_TABLET | Freq: Every day | ORAL | 1 refills | Status: DC

## 2015-12-08 MED ORDER — GLIPIZIDE ER 10 MG PO TB24: 10.0000 mg | ORAL_TABLET | Freq: Every day | ORAL | 1 refills | Status: DC

## 2015-12-08 NOTE — Telephone Encounter (Signed)
Sent into pharmacy via escript. Thanks!

## 2015-12-08 NOTE — Patient Instructions (Addendum)
Take metformin 1000 twice a day Take glipizide once a day  Stop lopid Start pravastatin 40 dailt

## 2015-12-08 NOTE — Progress Notes (Signed)
Charles Travis, is a 55 y.o. male  ZOX:[id number]  [id number]  DOB - Aug 21, 1960  CC:  Chief Complaint  Patient presents with  . Diabetes  . Hypertension       HPI: Charles Travis is a 55 y.o. male here for follow-up chronic conditions. He has diabetes and hypertension. He is currently on lisinopril 20-25, amlodipine 10 mg. Metformin 100 bid. He has been prescribed glipizide but is not taking that. He is also on Lopid but not a statin.   He reports feeling well. He reports having both BP medications today. He reports trying to follow a good diabetic diet, walks a lot a work but no regular exercise otherwise.   Health Maintenance: He needs a flu shot, HIV and HEP C screening He reports having a diabetic eye exam coming up in a couple of weeks. He reports having a neg colonscopy about 5 years ago.   Allergies  Allergen Reactions  . Shrimp [Shellfish Allergy] Itching   Past Medical History:  Diagnosis Date  . Diabetes mellitus without complication (HCC)   . HTN (hypertension)    Current Outpatient Prescriptions on File Prior to Visit  Medication Sig Dispense Refill  . allopurinol (ZYLOPRIM) 300 MG tablet Take 1 tablet (300 mg total) by mouth daily. 30 tablet 3  . aspirin EC 81 MG tablet Take 1 tablet (81 mg total) by mouth daily.    . ferrous sulfate 325 (65 FE) MG tablet Take 325 mg by mouth daily with breakfast.    . lisinopril-hydrochlorothiazide (PRINZIDE,ZESTORETIC) 20-25 MG tablet Take 1 tablet by mouth daily. 90 tablet 3  . albuterol (PROVENTIL HFA;VENTOLIN HFA) 108 (90 BASE) MCG/ACT inhaler Inhale 2 puffs into the lungs every 6 (six) hours as needed for wheezing or shortness of breath. (Patient not taking: Reported on 12/08/2015) 1 Inhaler 1  . Menthol-Methyl Salicylate (ICY HOT) 10-30 % STCK Apply 1 application topically daily as needed (joint pain).    . naproxen (NAPROSYN) 500 MG tablet Take 1 tablet (500 mg total) by mouth 2 (two) times daily with a meal. (Patient  not taking: Reported on 12/08/2015) 15 tablet 0  . sildenafil (VIAGRA) 25 MG tablet Take 1 tablet (25 mg total) by mouth as needed for erectile dysfunction. (Patient not taking: Reported on 12/08/2015) 20 tablet 0  . sitaGLIPtin-metformin (JANUMET) 50-1000 MG tablet Take 1 tablet by mouth 2 (two) times daily with a meal. (Patient not taking: Reported on 12/08/2015) 60 tablet 5  . traMADol (ULTRAM) 50 MG tablet Take 1 tablet (50 mg total) by mouth every 6 (six) hours as needed. (Patient not taking: Reported on 12/08/2015) 15 tablet 0   No current facility-administered medications on file prior to visit.    Family History  Problem Relation Age of Onset  . Cancer Mother   . Diabetes Father    Social History   Social History  . Marital status: Widowed    Spouse name: N/A  . Number of children: N/A  . Years of education: N/A   Occupational History  . Not on file.   Social History Main Topics  . Smoking status: Former Smoker    Quit date: 01/22/2006  . Smokeless tobacco: Never Used  . Alcohol use Yes     Comment: occasionally  . Drug use: No  . Sexual activity: Not Currently   Other Topics Concern  . Not on file   Social History Narrative  . No narrative on file    Review of Systems: Constitutional: Negative  Skin: Negative HENT: Negative  Eyes: Negative  Neck: Negative Respiratory: Negative Cardiovascular: Negative Gastrointestinal: Negative Genitourinary: Negative  Musculoskeletal: Negative   Neurological: Negative  Hematological: Negative  Psychiatric/Behavioral: Negative    Objective:   Vitals:   12/08/15 0904  BP: (!) 151/81  Pulse: 90  Resp: 14  Temp: 98.4 F (36.9 C)    Physical Exam: Constitutional: Patient appears well-developed and well-nourished. No distress. HENT: Normocephalic, atraumatic, External right and left ear normal. Oropharynx is clear and moist.  Eyes: Conjunctivae and EOM are normal. PERRLA, no scleral icterus. Neck: Normal ROM. Neck  supple. No lymphadenopathy, No thyromegaly. CVS: RRR, S1/S2 +, no murmurs, no gallops, no rubs Pulmonary: Effort and breath sounds normal, no stridor, rhonchi, wheezes, rales.  Abdominal: Soft. Normoactive BS,, no distension, tenderness, rebound or guarding.  Musculoskeletal: Normal range of motion. No edema and no tenderness.  Neuro: Alert.Normal muscle tone coordination. Non-focal Skin: Skin is warm and dry. No rash noted. Not diaphoretic. No erythema. No pallor. Psychiatric: Normal mood and affect. Behavior, judgment, thought content normal.  Lab Results  Component Value Date   WBC 4.9 06/27/2015   HGB 12.6 (L) 06/27/2015   HCT 39.1 06/27/2015   MCV 76.7 (L) 06/27/2015   PLT 152 06/27/2015   Lab Results  Component Value Date   CREATININE 0.87 08/04/2015   BUN 16 08/04/2015   NA 136 08/04/2015   K 3.9 08/04/2015   CL 103 08/04/2015   CO2 25 08/04/2015    Lab Results  Component Value Date   HGBA1C 9.7 12/08/2015   Lipid Panel     Component Value Date/Time   CHOL 196 03/12/2015 1016   TRIG 221 (H) 03/12/2015 1016   HDL 34 (L) 03/12/2015 1016   CHOLHDL 5.8 (H) 03/12/2015 1016   VLDL 44 (H) 03/12/2015 1016   LDLCALC 118 03/12/2015 1016       Assessment and plan:   1. Type 2 diabetes mellitus without complication, unspecified long term insulin use status (HCC)  - HgB A1c - CBC with Differential - COMPLETE METABOLIC PANEL WITH GFR - Lipid panel  2. Screening for HIV (human immunodeficiency virus)  - HIV antibody (with reflex)  3. Encounter for hepatitis C screening test for low risk patient  - Hepatitis C Antibody  4. Need for prophylactic vaccination and inoculation against influenza - Flu Vaccine QUAD 36+ mos PF IM (Fluarix & Fluzone Quad PF)  5. Essential hypertension -Continue amlodipine and lisinopril hctz.  6. Type 2 diabetes mellitus with other specified complication, without long-term current use of insulin (HCC)  - glipiZIDE (GLUCOTROL XL) 10  MG 24 hr tablet; Take 1 tablet (10 mg total) by mouth daily.  Dispense: 90 tablet; Refill: 1   Return in about 3 months (around 03/09/2016) for Diabetes, HTN.  The patient was given clear instructions to go to ER or return to medical center if symptoms don't improve, worsen or new problems develop. The patient verbalized understanding.    Henrietta HooverLinda C Evalie Hargraves FNP  12/08/2015, 11:35 AM

## 2015-12-09 LAB — HEPATITIS C ANTIBODY: HCV Ab: NEGATIVE

## 2015-12-09 LAB — HIV ANTIBODY (ROUTINE TESTING W REFLEX): HIV 1&2 Ab, 4th Generation: NONREACTIVE

## 2016-01-27 ENCOUNTER — Ambulatory Visit (INDEPENDENT_AMBULATORY_CARE_PROVIDER_SITE_OTHER): Payer: BLUE CROSS/BLUE SHIELD | Admitting: Family Medicine

## 2016-01-27 ENCOUNTER — Other Ambulatory Visit: Payer: Self-pay | Admitting: Family Medicine

## 2016-01-27 DIAGNOSIS — Z119 Encounter for screening for infectious and parasitic diseases, unspecified: Secondary | ICD-10-CM

## 2016-01-28 NOTE — Progress Notes (Signed)
Charles Bibleat comes in to have nose swabbed for MRSA

## 2016-01-30 LAB — NASAL CULTURE (N/P): Organism ID, Bacteria: NORMAL

## 2016-03-22 ENCOUNTER — Ambulatory Visit: Payer: Self-pay | Admitting: Family Medicine

## 2016-04-05 ENCOUNTER — Ambulatory Visit (INDEPENDENT_AMBULATORY_CARE_PROVIDER_SITE_OTHER): Payer: BLUE CROSS/BLUE SHIELD | Admitting: Family Medicine

## 2016-04-05 ENCOUNTER — Encounter: Payer: Self-pay | Admitting: Family Medicine

## 2016-04-05 VITALS — BP 149/80 | HR 78 | Temp 97.9°F | Resp 18 | Ht 73.5 in | Wt 263.6 lb

## 2016-04-05 DIAGNOSIS — E1169 Type 2 diabetes mellitus with other specified complication: Secondary | ICD-10-CM | POA: Diagnosis not present

## 2016-04-05 DIAGNOSIS — M1990 Unspecified osteoarthritis, unspecified site: Secondary | ICD-10-CM

## 2016-04-05 DIAGNOSIS — I10 Essential (primary) hypertension: Secondary | ICD-10-CM | POA: Diagnosis not present

## 2016-04-05 DIAGNOSIS — Z8739 Personal history of other diseases of the musculoskeletal system and connective tissue: Secondary | ICD-10-CM | POA: Diagnosis not present

## 2016-04-05 LAB — COMPLETE METABOLIC PANEL WITH GFR
ALBUMIN: 4.6 g/dL (ref 3.6–5.1)
ALT: 13 U/L (ref 9–46)
AST: 17 U/L (ref 10–35)
Alkaline Phosphatase: 144 U/L — ABNORMAL HIGH (ref 40–115)
BUN: 14 mg/dL (ref 7–25)
CHLORIDE: 105 mmol/L (ref 98–110)
CO2: 29 mmol/L (ref 20–31)
Calcium: 9.5 mg/dL (ref 8.6–10.3)
Creat: 0.96 mg/dL (ref 0.70–1.33)
GFR, EST NON AFRICAN AMERICAN: 88 mL/min (ref >=60)
GFR, Est African American: >89 mL/min (ref >=60)
GLUCOSE: 106 mg/dL — AB (ref 65–99)
POTASSIUM: 3.9 mmol/L (ref 3.5–5.3)
SODIUM: 142 mmol/L (ref 135–146)
Total Bilirubin: 0.3 mg/dL (ref 0.2–1.2)
Total Protein: 7.2 g/dL (ref 6.1–8.1)

## 2016-04-05 LAB — CBC WITH DIFFERENTIAL/PLATELET
BASOS ABS: 42 {cells}/uL (ref 0–200)
Basophils Relative: 1 %
EOS PCT: 4 %
Eosinophils Absolute: 168 cells/uL (ref 15–500)
HEMATOCRIT: 35.3 % — AB (ref 38.5–50.0)
HEMOGLOBIN: 11.4 g/dL — AB (ref 13.2–17.1)
LYMPHS ABS: 1260 {cells}/uL (ref 850–3900)
Lymphocytes Relative: 30 %
MCH: 25.1 pg — AB (ref 27.0–33.0)
MCHC: 32.3 g/dL (ref 32.0–36.0)
MCV: 77.6 fL — ABNORMAL LOW (ref 80.0–100.0)
MONO ABS: 294 {cells}/uL (ref 200–950)
Monocytes Relative: 7 %
NEUTROS ABS: 2436 {cells}/uL (ref 1500–7800)
Neutrophils Relative %: 58 %
Platelets: 156 10*3/uL (ref 140–400)
RBC: 4.55 MIL/uL (ref 4.20–5.80)
RDW: 15.2 % — ABNORMAL HIGH (ref 11.0–15.0)
WBC: 4.2 10*3/uL (ref 3.8–10.8)

## 2016-04-05 LAB — POCT URINALYSIS DIP (DEVICE)
BILIRUBIN URINE: NEGATIVE
Glucose, UA: NEGATIVE mg/dL
HGB URINE DIPSTICK: NEGATIVE
Ketones, ur: NEGATIVE mg/dL
LEUKOCYTES UA: NEGATIVE
Nitrite: NEGATIVE
PH: 5.5 (ref 5.0–8.0)
Protein, ur: NEGATIVE mg/dL
SPECIFIC GRAVITY, URINE: 1.025 (ref 1.005–1.030)
UROBILINOGEN UA: 0.2 mg/dL (ref 0.0–1.0)

## 2016-04-05 LAB — POCT GLYCOSYLATED HEMOGLOBIN (HGB A1C): Hemoglobin A1C: 8.4

## 2016-04-05 MED ORDER — KETOROLAC TROMETHAMINE 60 MG/2ML IM SOLN
60.0000 mg | Freq: Once | INTRAMUSCULAR | Status: AC
  Administered 2016-04-05: 60 mg via INTRAMUSCULAR

## 2016-04-05 MED ORDER — ALLOPURINOL 300 MG PO TABS: 300.0000 mg | ORAL_TABLET | Freq: Every day | ORAL | 3 refills | Status: AC

## 2016-04-05 NOTE — Patient Instructions (Addendum)
Diabetes and Foot Care Diabetes may cause you to have problems because of poor blood supply (circulation) to your feet and legs. This may cause the skin on your feet to become thinner, break easier, and heal more slowly. Your skin may become dry, and the skin may peel and crack. You may also have nerve damage in your legs and feet causing decreased feeling in them. You may not notice minor injuries to your feet that could lead to infections or more serious problems. Taking care of your feet is one of the most important things you can do for yourself. Follow these instructions at home:  Wear shoes at all times, even in the house. Do not go barefoot. Bare feet are easily injured.  Check your feet daily for blisters, cuts, and redness. If you cannot see the bottom of your feet, use a mirror or ask someone for help.  Wash your feet with warm water (do not use hot water) and mild soap. Then pat your feet and the areas between your toes until they are completely dry. Do not soak your feet as this can dry your skin.  Apply a moisturizing lotion or petroleum jelly (that does not contain alcohol and is unscented) to the skin on your feet and to dry, brittle toenails. Do not apply lotion between your toes.  Trim your toenails straight across. Do not dig under them or around the cuticle. File the edges of your nails with an emery board or nail file.  Do not cut corns or calluses or try to remove them with medicine.  Wear clean socks or stockings every day. Make sure they are not too tight. Do not wear knee-high stockings since they may decrease blood flow to your legs.  Wear shoes that fit properly and have enough cushioning. To break in new shoes, wear them for just a few hours a day. This prevents you from injuring your feet. Always look in your shoes before you put them on to be sure there are no objects inside.  Do not cross your legs. This may decrease the blood flow to your feet.  If you find a  minor scrape, cut, or break in the skin on your feet, keep it and the skin around it clean and dry. These areas may be cleansed with mild soap and water. Do not cleanse the area with peroxide, alcohol, or iodine.  When you remove an adhesive bandage, be sure not to damage the skin around it.  If you have a wound, look at it several times a day to make sure it is healing.  Do not use heating pads or hot water bottles. They may burn your skin. If you have lost feeling in your feet or legs, you may not know it is happening until it is too late.  Make sure your health care provider performs a complete foot exam at least annually or more often if you have foot problems. Report any cuts, sores, or bruises to your health care provider immediately. Contact a health care provider if:  You have an injury that is not healing.  You have cuts or breaks in the skin.  You have an ingrown nail.  You notice redness on your legs or feet.  You feel burning or tingling in your legs or feet.  You have pain or cramps in your legs and feet.  Your legs or feet are numb.  Your feet always feel cold. Get help right away if:  There is increasing   redness, swelling, or pain in or around a wound.  There is a red line that goes up your leg.  Pus is coming from a wound.  You develop a fever or as directed by your health care provider.  You notice a bad smell coming from an ulcer or wound. This information is not intended to replace advice given to you by your health care provider. Make sure you discuss any questions you have with your health care provider. Document Released: 02/13/2000 Document Revised: 07/24/2015 Document Reviewed: 07/25/2012 Elsevier Interactive Patient Education  2017 [employer]  Gout Gout is painful swelling that can occur in some of your joints. Gout is a type of arthritis. This condition is caused by having too much uric acid in your body. Uric acid is a chemical that forms when  your body breaks down substances called purines. Purines are important for building body proteins. When your body has too much uric acid, sharp crystals can form and build up inside your joints. This causes pain and swelling. Gout attacks can happen quickly and be very painful (acute gout). Over time, the attacks can affect more joints and become more frequent (chronic gout). Gout can also cause uric acid to build up under your skin and inside your kidneys. What are the causes? This condition is caused by too much uric acid in your blood. This can occur because:  Your kidneys do not remove enough uric acid from your blood. This is the most common cause.  Your body makes too much uric acid. This can occur with some cancers and cancer treatments. It can also occur if your body is breaking down too many red blood cells (hemolytic anemia).  You eat too many foods that are high in purines. These foods include organ meats and some seafood. Alcohol, especially beer, is also high in purines. A gout attack may be triggered by trauma or stress. What increases the risk? This condition is more likely to develop in people who:  Have a family history of gout.  Are male and middle-aged.  Are male and have gone through menopause.  Are obese.  Frequently drink alcohol, especially beer.  Are dehydrated.  Lose weight too quickly.  Have an organ transplant.  Have lead poisoning.  Take certain medicines, including aspirin, cyclosporine, diuretics, levodopa, and niacin.  Have kidney disease or psoriasis. What are the signs or symptoms? An attack of acute gout happens quickly. It usually occurs in just one joint. The most common place is the big toe. Attacks often start at night. Other joints that may be affected include joints of the feet, ankle, knee, fingers, wrist, or elbow. Symptoms may include:  Severe pain.  Warmth.  Swelling.  Stiffness.  Tenderness. The affected joint may be very  painful to touch.  Shiny, red, or purple skin.  Chills and fever. Chronic gout may cause symptoms more frequently. More joints may be involved. You may also have white or yellow lumps (tophi) on your hands or feet or in other areas near your joints. How is this diagnosed? This condition is diagnosed based on your symptoms, medical history, and physical exam. You may have tests, such as:  Blood tests to measure uric acid levels.  Removal of joint fluid with a needle (aspiration) to look for uric acid crystals.  X-rays to look for joint damage. How is this treated? Treatment for this condition has two phases: treating an acute attack and preventing future attacks. Acute gout treatment may include medicines to  reduce pain and swelling, including:  NSAIDs.  Steroids. These are strong anti-inflammatory medicines that can be taken by mouth (orally) or injected into a joint.  Colchicine. This medicine relieves pain and swelling when it is taken soon after an attack. It can be given orally or through an IV tube. Preventive treatment may include:  Daily use of smaller doses of NSAIDs or colchicine.  Use of a medicine that reduces uric acid levels in your blood.  Changes to your diet. You may need to see a specialist about healthy eating (dietitian). Follow these instructions at home: During a Gout Attack  If directed, apply ice to the affected area:  Put ice in a plastic bag.  Place a towel between your skin and the bag.  Leave the ice on for 20 minutes, 2-3 times a day.  Rest the joint as much as possible. If the affected joint is in your leg, you may be given crutches to use.  Raise (elevate) the affected joint above the level of your heart as often as possible.  Drink enough fluids to keep your urine clear or pale yellow.  Take over-the-counter and prescription medicines only as told by your health care provider.  Do not drive or operate heavy machinery while taking  prescription pain medicine.  Follow instructions from your health care provider about eating or drinking restrictions.  Return to your normal activities as told by your health care provider. Ask your health care provider what activities are safe for you. Avoiding Future Gout Attacks  Follow a low-purine diet as told by your dietitian or health care provider. Avoid foods and drinks that are high in purines, including liver, kidney, anchovies, asparagus, herring, mushrooms, mussels, and beer.  Limit alcohol intake to no more than 1 drink a day for nonpregnant women and 2 drinks a day for men. One drink equals 12 oz of beer, 5 oz of wine, or 1 oz of hard liquor.  Maintain a healthy weight or lose weight if you are overweight. If you want to lose weight, talk with your health care provider. It is important that you do not lose weight too quickly.  Start or maintain an exercise program as told by your health care provider.  Drink enough fluids to keep your urine clear or pale yellow.  Take over-the-counter and prescription medicines only as told by your health care provider.  Keep all follow-up visits as told by your health care provider. This is important. Contact a health care provider if:  You have another gout attack.  You continue to have symptoms of a gout attack after10 days of treatment.  You have side effects from your medicines.  You have chills or a fever.  You have burning pain when you urinate.  You have pain in your lower back or belly. Get help right away if:  You have severe or uncontrolled pain.  You cannot urinate. This information is not intended to replace advice given to you by your health care provider. Make sure you discuss any questions you have with your health care provider. Document Released: 02/13/2000 Document Revised: 07/24/2015 Document Reviewed: 11/28/2014 Elsevier Interactive Patient Education  2017 [employer]  Managing Your  Hypertension Hypertension is commonly called high blood pressure. Blood pressure is a measurement of how strongly your blood is pressing against the walls of your arteries. Arteries are blood vessels that carry blood from your heart throughout your body. Blood pressure does not stay the same. It rises when you are  active, excited, or nervous. It lowers when you are sleeping or relaxed. If the numbers that measure your blood pressure stay above normal most of the time, you are at risk for health problems. Hypertension is a long-term (chronic) condition in which blood pressure is elevated. This condition often has no signs or symptoms. The cause of the condition is usually not known. What are blood pressure readings? A blood pressure reading is recorded as two numbers, such as "120 over 80" (or 120/80). The first ("top") number is called the systolic pressure. It is a measure of the pressure in your arteries as the heart beats. The second ("bottom") number is called the diastolic pressure. It is a measure of the pressure in your arteries as the heart relaxes between beats. What does my blood pressure reading mean? Blood pressure is classified into four stages. Based on your blood pressure reading, your health care provider may use the following stages to determine what type of treatment, if any, is needed. Systolic pressure and diastolic pressure are measured in a unit called mm Hg. Normal  Systolic pressure: below 120.  Diastolic pressure: below 80. Prehypertension  Systolic pressure: 120-139.  Diastolic pressure: 80-89. Hypertension stage 1  Systolic pressure: 140-159.  Diastolic pressure: 90-99. Hypertension stage 2  Systolic pressure: 160 or above.  Diastolic pressure: 100 or above. What health risks are associated with hypertension? Managing your hypertension is an important responsibility. Uncontrolled hypertension can lead to:  A heart attack.  A stroke.  A weakened blood  vessel (aneurysm).  Heart failure.  Kidney damage.  Eye damage.  Metabolic syndrome.  Memory and concentration problems. What changes can I make to manage my hypertension? Hypertension can be managed effectively by making lifestyle changes and possibly by taking medicines. Your health care provider will help you come up with a plan to bring your blood pressure within a normal range. Your plan should include the following: Monitoring  Monitor your blood pressure at home as told by your health care provider. Your personal target blood pressure may vary depending on your medical conditions, your age, and other factors.  Have your blood pressure rechecked as told by your health care provider. Lifestyle  Lose weight if necessary.  Get at least 30-45 minutes of aerobic exercise at least 4 times a week.  Do not use any products that contain nicotine or tobacco, such as cigarettes and e-cigarettes. If you need help quitting, ask your health care provider.  Learn ways to reduce stress.  Control any chronic conditions, such as high cholesterol or diabetes. Eating and drinking  Follow the DASH diet. This diet is high in fruits, vegetables, and whole grains. It is low in salt, red meat, and added sugars.  Keep your sodium intake below 2,300 mg per day.  Limit alcoholic beverages. Communication  Review all the medicines you take with your health care provider because there may be side effects or interactions.  Talk with your health care provider about your diet, exercise habits, and other lifestyle factors that may be contributing to hypertension.  See your health care provider regularly. Your health care provider can help you create and adjust your plan for managing hypertension. Will I need medicine to control my blood pressure? Your health care provider may prescribe medicine if lifestyle changes are not enough to get your blood pressure under control, and if one of the following is  true:  You are 9-75 years of age, and your systolic blood pressure is 140 or higher.  You are 61 years of age or older, and your systolic blood pressure is 150 or higher.  Your diastolic blood pressure is 90 or higher.  You have diabetes, and your systolic blood pressure is over 140 or your diastolic blood pressure is over 90.  You have kidney disease, and your blood pressure is above 140/90.  You have heart disease or a history of stroke, and your blood pressure is 140/90 or higher. Take medicines only as told by your health care provider. Follow the directions carefully. Blood pressure medicines must be taken as prescribed. The medicine does not work as well when you skip doses. Skipping doses also puts you at risk for problems. Contact a health care provider if:  You think you are having a reaction to medicines you have taken.  You have repeated (recurrent) headaches.  You feel dizzy.  You have swelling in your ankles.  You have trouble with your vision. Get help right away if:  You develop a severe headache or confusion.  You have unusual weakness or numbness, or you feel faint.  You have severe pain in your chest or abdomen.  You vomit repeatedly.  You have trouble breathing. This information is not intended to replace advice given to you by your health care provider. Make sure you discuss any questions you have with your health care provider. Document Released: 11/10/2011 Document Revised: 10/21/2015 Document Reviewed: 05/16/2015 Elsevier Interactive Patient Education  2017 [employer].

## 2016-04-05 NOTE — Progress Notes (Signed)
Subjective:    Patient ID: Charles Travis, male    DOB: 1960-08-13, 56 y.o.   MRN: [id number]  Hypertension  This is a chronic problem. The current episode started more than 1 year ago. The problem has been gradually improving since onset. The problem is uncontrolled. Pertinent negatives include no anxiety, blurred vision, chest pain, headaches, malaise/fatigue, neck pain, orthopnea, palpitations, peripheral edema, PND, shortness of breath or sweats. There are no associated agents to hypertension. Risk factors for coronary artery disease include diabetes mellitus, dyslipidemia, obesity and male gender. Past treatments include calcium channel blockers and ACE inhibitors. The current treatment provides mild improvement. Compliance problems include diet.  There is no history of angina, kidney disease, CAD/MI, CVA, heart failure, left ventricular hypertrophy, PVD or retinopathy. There is no history of sleep apnea or a thyroid problem.  Diabetes  He presents for his follow-up diabetic visit. He has type 2 diabetes mellitus. His disease course has been stable. Pertinent negatives for hypoglycemia include no confusion, dizziness, headaches, hunger, pallor, seizures or sweats. Pertinent negatives for diabetes include no blurred vision, no chest pain, no fatigue, no polydipsia, no polyphagia, no polyuria and no weight loss. Symptoms are stable. Pertinent negatives for diabetic complications include no CVA, PVD or retinopathy. Risk factors for coronary artery disease include dyslipidemia, hypertension, male sex, obesity and sedentary lifestyle. Current diabetic treatment includes oral agent (triple therapy). He is compliant with treatment all of the time. He is following a high fat/cholesterol diet. There is no change in his home blood glucose trend. An ACE inhibitor/angiotensin II receptor blocker is being taken. He does not see a podiatrist.Eye exam is current.  Arthritis  Presents for initial visit. The  disease course has been fluctuating. The condition has lasted for 6 months. He complains of pain and stiffness. Affected locations include the left wrist, right wrist, left DIP, left knee and right knee. His pain is at a severity of 6/10. Pertinent negatives include no dry mouth, dysuria, fatigue, fever, pain at night, pain while resting, uveitis or weight loss. There is no history of chronic back pain, lupus, osteoarthritis or psoriasis.  His pertinent risk factors include overuse. Past treatments include nothing.   Past Medical History:  Diagnosis Date  . Diabetes mellitus without complication (HCC)   . HTN (hypertension)    Social History   Social History  . Marital status: Widowed    Spouse name: N/A  . Number of children: N/A  . Years of education: N/A   Occupational History  . Not on file.   Social History Main Topics  . Smoking status: Former Smoker    Quit date: 01/22/2006  . Smokeless tobacco: Never Used  . Alcohol use Yes     Comment: occasionally  . Drug use: No  . Sexual activity: Not Currently   Other Topics Concern  . Not on file   Social History Narrative  . No narrative on file   Allergies  Allergen Reactions  . Shrimp [Shellfish Allergy] Itching    Review of Systems  Constitutional: Negative.  Negative for fatigue, fever, malaise/fatigue and weight loss.  HENT: Negative.   Eyes: Negative.  Negative for blurred vision.  Respiratory: Negative for shortness of breath.   Cardiovascular: Negative for chest pain, palpitations, orthopnea and PND.  Endocrine: Negative.  Negative for polydipsia, polyphagia and polyuria.  Genitourinary: Negative.  Negative for dysuria.  Musculoskeletal: Positive for arthritis and stiffness. Negative for neck pain.  Skin: Negative.  Negative for pallor.  Allergic/Immunologic: Negative.  Negative for immunocompromised state.  Neurological: Negative.  Negative for dizziness, seizures and headaches.  Hematological: Negative.    Psychiatric/Behavioral: Negative.  Negative for confusion.      Objective:   Physical Exam  Constitutional: He is oriented to person, place, and time. He appears well-developed and well-nourished.  HENT:  Head: Normocephalic and atraumatic.  Right Ear: External ear normal.  Left Ear: External ear normal.  Nose: Nose normal.  Mouth/Throat: Oropharynx is clear and moist.  Eyes: Conjunctivae and EOM are normal. Pupils are equal, round, and reactive to light.  Neck: Normal range of motion. Neck supple.  Cardiovascular: Normal rate, regular rhythm, normal heart sounds and intact distal pulses.   Pulmonary/Chest: Effort normal and breath sounds normal.  Abdominal: Soft. Bowel sounds are normal.  Neurological: He is alert and oriented to person, place, and time. He has normal reflexes.  Skin: Skin is warm, dry and intact.  Psychiatric: He has a normal mood and affect. His behavior is normal. Judgment and thought content normal. Cognition and memory are normal.     BP (!) 149/80 (BP Location: Right Arm, Patient Position: Sitting, Cuff Size: Large)   Pulse 78   Temp 97.9 F (36.6 C) (Oral)   Resp 18   Ht 6' 1.5" (1.867 m)   Wt 263 lb 9.6 oz (119.6 kg)   SpO2 100%   BMI 34.31 kg/m  Assessment & Plan:  1. Essential hypertension Blood pressure is at goal on current medication regimen. Will continue medications.   2. Type 2 diabetes mellitus with other specified complication, without long-term current use of insulin (HCC) Recommend a lowfat, low carbohydrate diet divided over 5-6 small meals, increase water intake to 6-8 glasses, and 150 minutes per week of cardiovascular exercise.   - COMPLETE METABOLIC PANEL WITH GFR - CBC with Differential - HgB A1c  3. Arthritis  - Sedimentation Rate - C-reactive protein - ketorolac (TORADOL) injection 60 mg; Inject 2 mLs (60 mg total) into the muscle once.  4. History of gout - allopurinol (ZYLOPRIM) 300 MG tablet; Take 1 tablet (300 mg  total) by mouth daily.  Dispense: 30 tablet; Refill: 3    RTC: 3 months for chronic conditions   Rheanne Cortopassi M, FNP

## 2016-04-06 LAB — SEDIMENTATION RATE: SED RATE: 7 mm/h (ref 0–20)

## 2016-04-06 LAB — C-REACTIVE PROTEIN: CRP: 3.6 mg/L (ref <8.0)

## 2016-05-17 ENCOUNTER — Telehealth: Payer: Self-pay

## 2016-05-17 MED ORDER — METFORMIN HCL 1000 MG PO TABS: 1000.0000 mg | ORAL_TABLET | Freq: Two times a day (BID) | ORAL | 1 refills | Status: DC

## 2016-05-17 NOTE — Telephone Encounter (Signed)
This has been sent into pharmacy. Thanks!  

## 2016-05-18 ENCOUNTER — Other Ambulatory Visit: Payer: Self-pay

## 2016-05-18 MED ORDER — METFORMIN HCL 1000 MG PO TABS: 1000.0000 mg | ORAL_TABLET | Freq: Two times a day (BID) | ORAL | 1 refills | Status: AC

## 2016-07-03 ENCOUNTER — Other Ambulatory Visit: Payer: Self-pay | Admitting: Family Medicine

## 2016-07-03 DIAGNOSIS — E1169 Type 2 diabetes mellitus with other specified complication: Secondary | ICD-10-CM

## 2016-07-05 ENCOUNTER — Ambulatory Visit: Payer: Self-pay | Admitting: Family Medicine

## 2016-08-02 ENCOUNTER — Ambulatory Visit: Payer: Self-pay | Admitting: Family Medicine

## 2016-10-04 ENCOUNTER — Other Ambulatory Visit: Payer: Self-pay

## 2016-10-04 DIAGNOSIS — I10 Essential (primary) hypertension: Secondary | ICD-10-CM

## 2016-10-04 MED ORDER — AMLODIPINE BESYLATE 10 MG PO TABS: 10.0000 mg | ORAL_TABLET | Freq: Every day | ORAL | 1 refills | Status: DC

## 2016-10-04 NOTE — Telephone Encounter (Signed)
Refill for amlodipine sent into pharmacy. Thanks!  

## 2016-10-11 ENCOUNTER — Other Ambulatory Visit: Payer: Self-pay

## 2016-10-11 DIAGNOSIS — I10 Essential (primary) hypertension: Secondary | ICD-10-CM

## 2016-10-11 MED ORDER — AMLODIPINE BESYLATE 10 MG PO TABS: 10.0000 mg | ORAL_TABLET | Freq: Every day | ORAL | 1 refills | Status: AC

## 2016-10-11 NOTE — Telephone Encounter (Signed)
Refill for amlodipine sent into [facility] in [place]. Thanks!

## 2017-04-02 IMAGING — CR DG CHEST 2V
2 series · 2 of 2 positions shown · non-contrast
Comparison: 01/12/2015

CLINICAL DATA: Shortness of breath, sinus drainage, increased
tiredness, feeling winded for 2 weeks, bronchitis, hypertension,
diabetes mellitus, former smoker

EXAM:
CHEST  2 VIEW

[w chest pa]
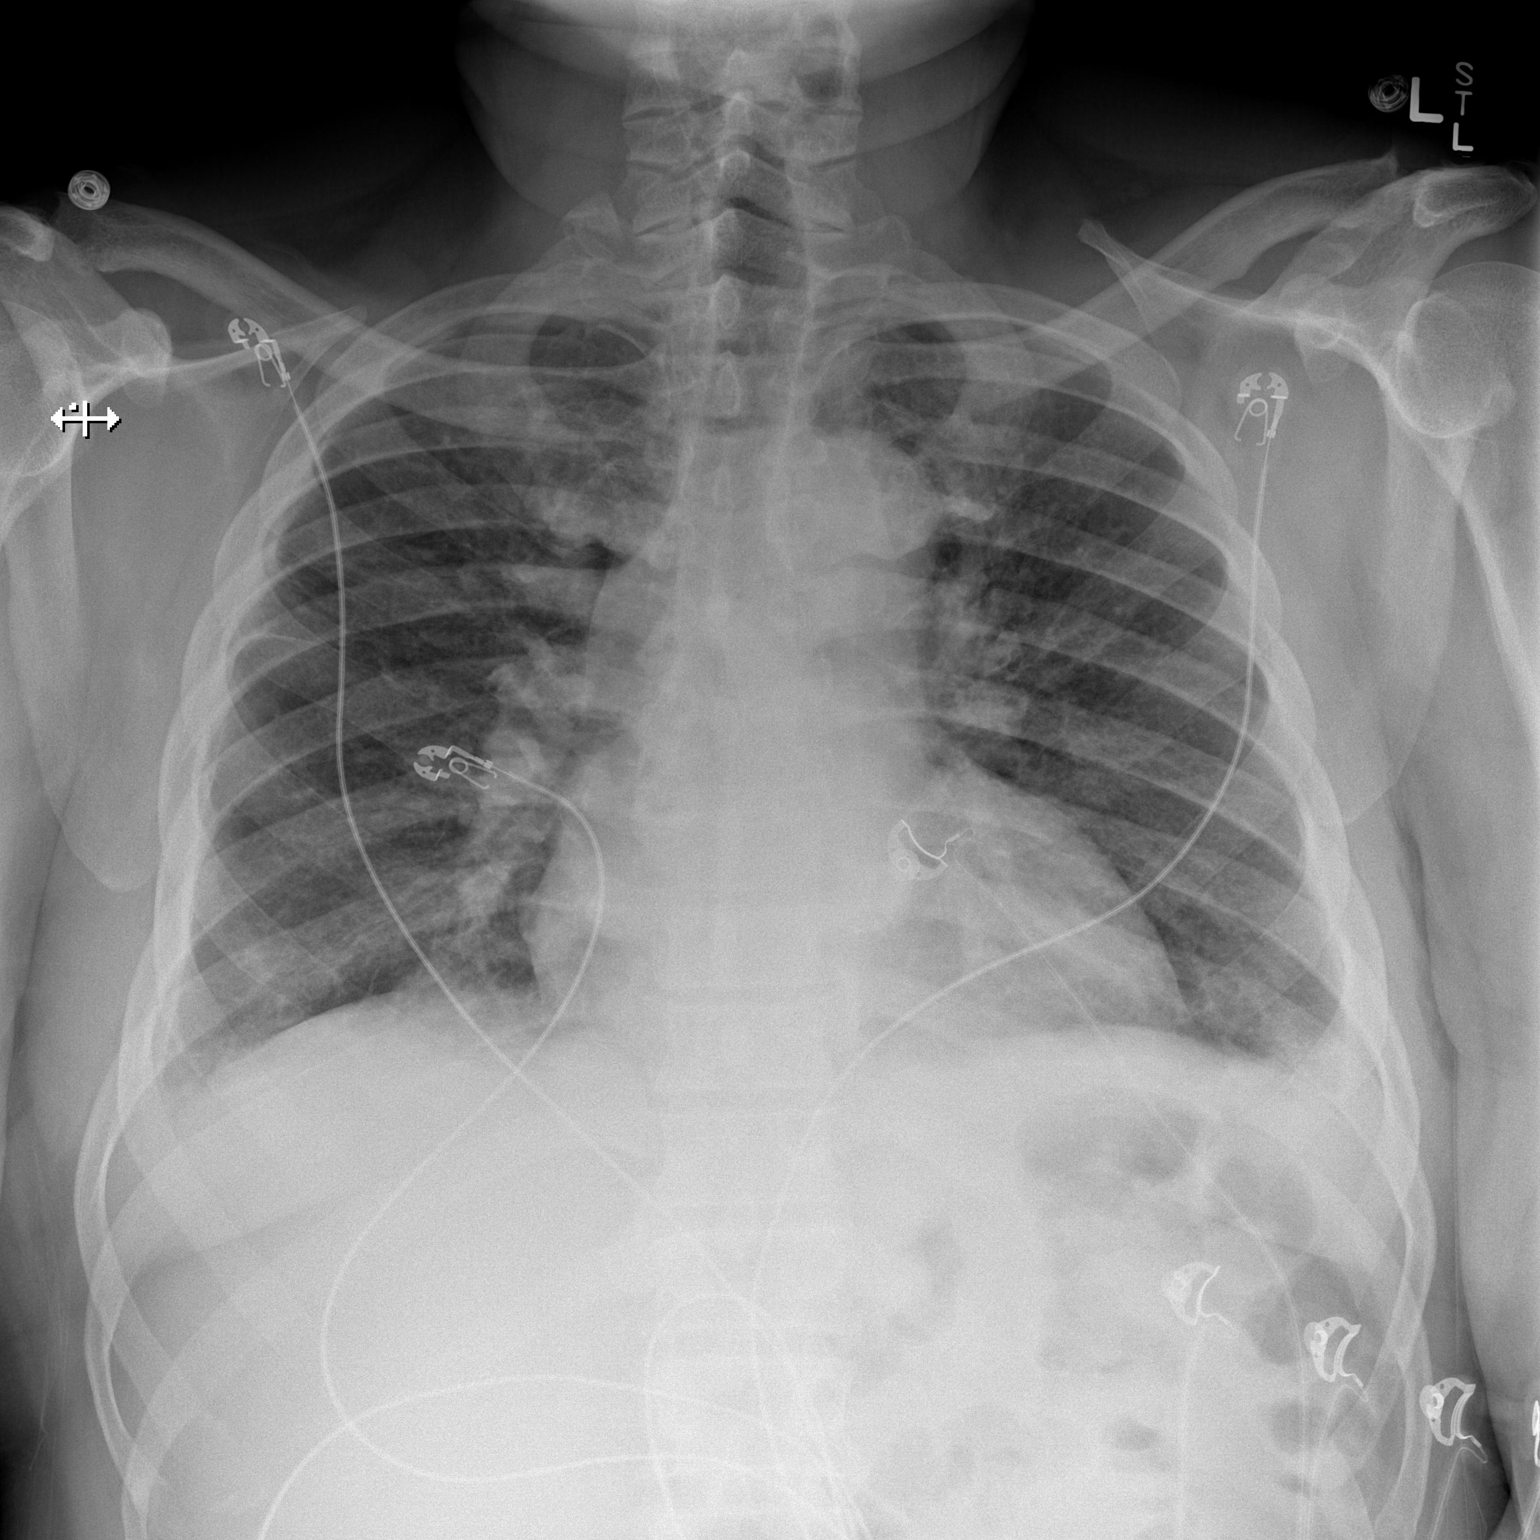

[w chest lat]
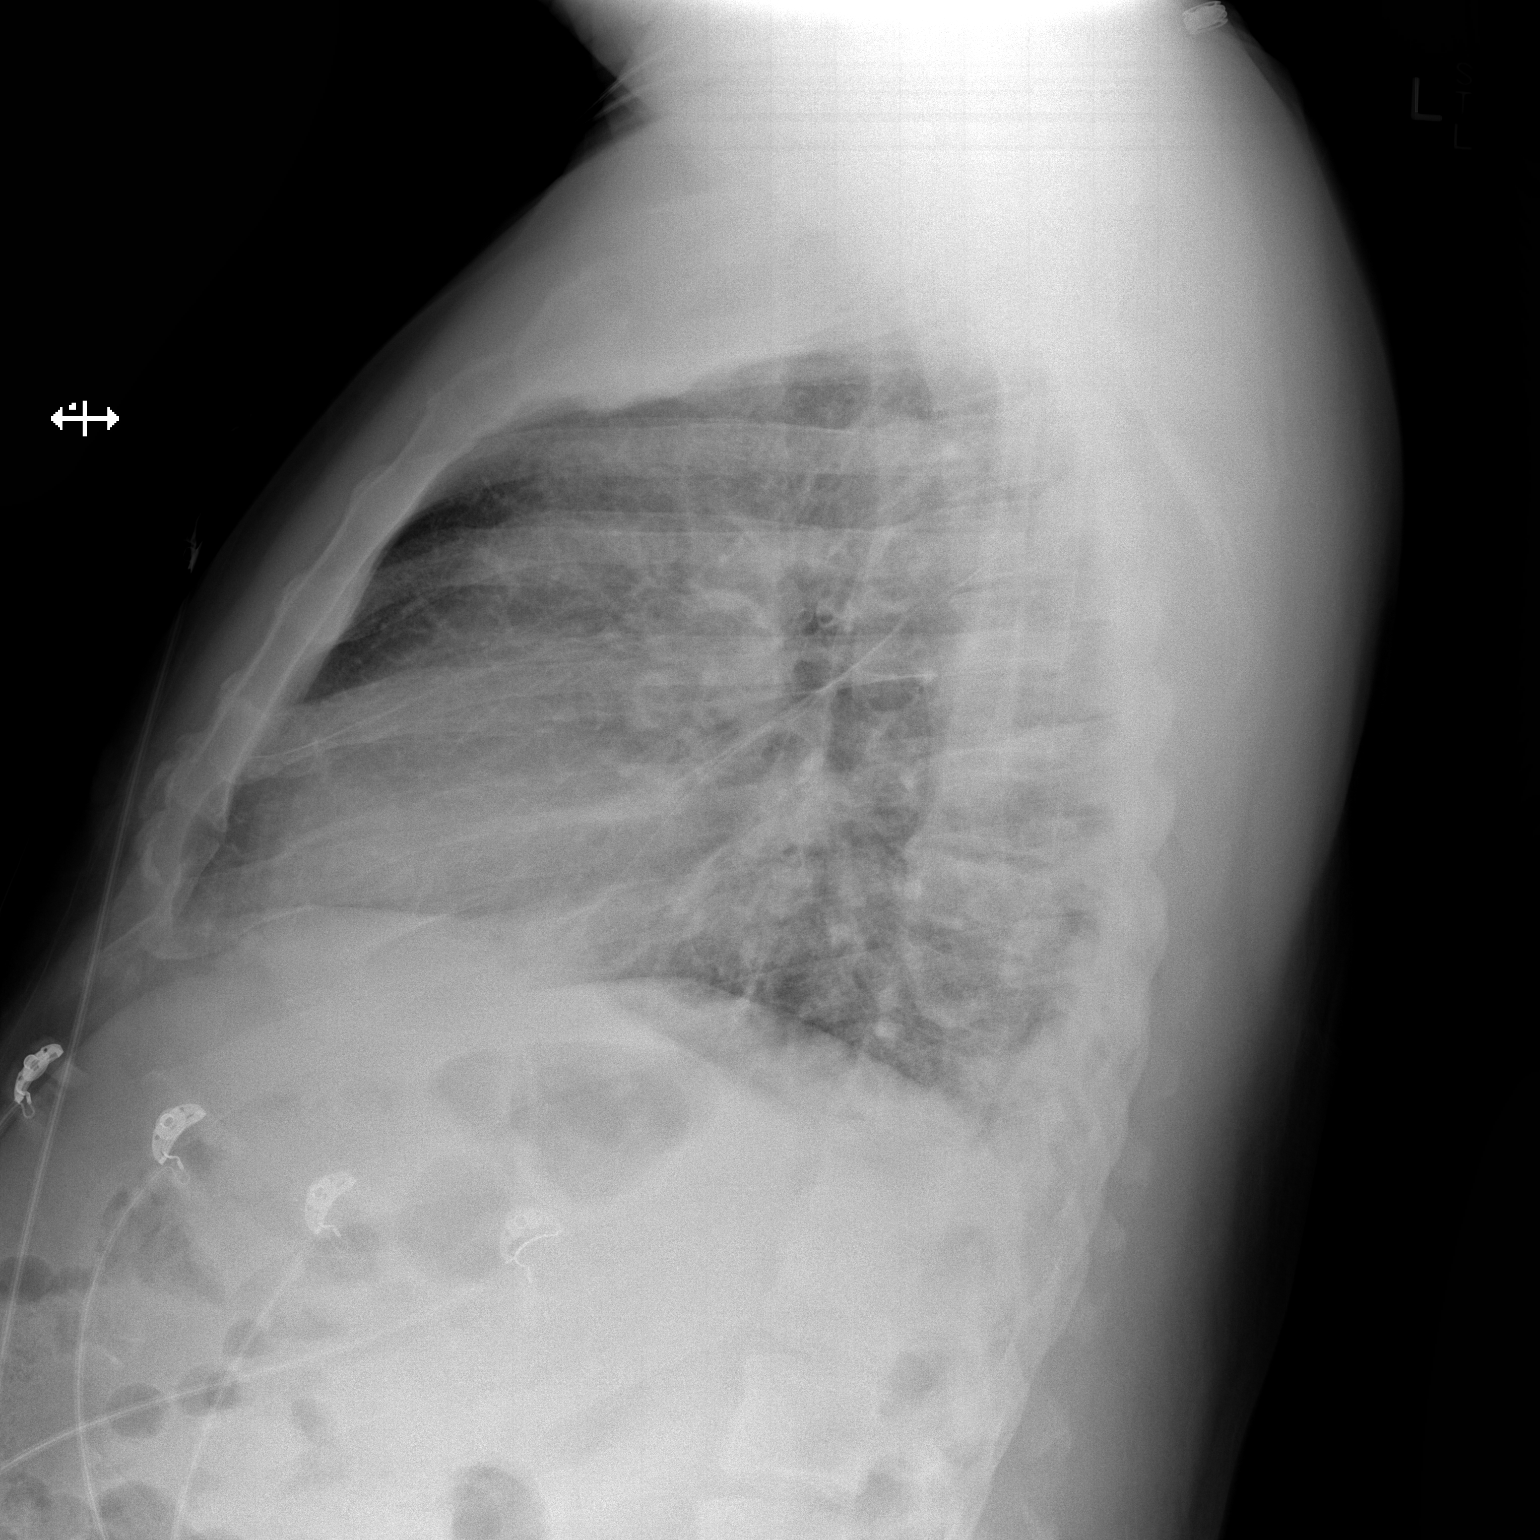

[2 of 2 positions shown; findings below may reference images not displayed]

FINDINGS: Enlargement of cardiac silhouette.

Tortuous aorta.

Mediastinal contours and pulmonary vascularity normal.

Bronchitic changes with small bibasilar effusions and minimal
basilar atelectasis.

No definite acute infiltrate or pneumothorax.

Bones unremarkable.
IMPRESSION: Enlargement of cardiac silhouette.

Bronchitic changes with mild bibasilar atelectasis and tiny pleural
effusions bilaterally.

## 2017-05-25 ENCOUNTER — Other Ambulatory Visit: Payer: Self-pay | Admitting: Family Medicine

## 2017-05-25 DIAGNOSIS — I10 Essential (primary) hypertension: Secondary | ICD-10-CM

## 2017-07-11 ENCOUNTER — Other Ambulatory Visit: Payer: Self-pay | Admitting: Family Medicine

## 2017-07-11 DIAGNOSIS — I10 Essential (primary) hypertension: Secondary | ICD-10-CM
